# Patient Record
Sex: Female | Born: 1995 | Race: Black or African American | Hispanic: No | Marital: Single | Smoking: Never smoker
Health system: Southern US, Community
[De-identification: ages and names within clinical notes are randomized; demographics above are authoritative.]

## PROBLEM LIST (undated history)

## (undated) ENCOUNTER — Inpatient Hospital Stay (HOSPITAL_COMMUNITY): Payer: Self-pay

## (undated) DIAGNOSIS — D649 Anemia, unspecified: Secondary | ICD-10-CM

## (undated) DIAGNOSIS — R030 Elevated blood-pressure reading, without diagnosis of hypertension: Secondary | ICD-10-CM

## (undated) DIAGNOSIS — H52203 Unspecified astigmatism, bilateral: Secondary | ICD-10-CM

## (undated) DIAGNOSIS — I10 Essential (primary) hypertension: Secondary | ICD-10-CM

## (undated) HISTORY — DX: Elevated blood-pressure reading, without diagnosis of hypertension: R03.0

## (undated) HISTORY — DX: Unspecified astigmatism, bilateral: H52.203

## (undated) HISTORY — PX: DILATION AND CURETTAGE OF UTERUS: SHX78

## (undated) HISTORY — PX: NO PAST SURGERIES: SHX2092

---
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
## 2019-01-12 ENCOUNTER — Other Ambulatory Visit: Payer: Self-pay

## 2019-01-12 DIAGNOSIS — Z20822 Contact with and (suspected) exposure to covid-19: Secondary | ICD-10-CM

## 2019-01-14 LAB — NOVEL CORONAVIRUS, NAA: SARS-CoV-2, NAA: NOT DETECTED

## 2019-02-18 ENCOUNTER — Ambulatory Visit: Payer: HRSA Program | Attending: Internal Medicine

## 2019-02-18 DIAGNOSIS — Z20822 Contact with and (suspected) exposure to covid-19: Secondary | ICD-10-CM | POA: Insufficient documentation

## 2019-02-19 LAB — NOVEL CORONAVIRUS, NAA: SARS-CoV-2, NAA: NOT DETECTED

## 2019-02-22 ENCOUNTER — Encounter (HOSPITAL_COMMUNITY): Payer: Self-pay | Admitting: Emergency Medicine

## 2019-02-22 ENCOUNTER — Emergency Department (HOSPITAL_COMMUNITY): Payer: PRIVATE HEALTH INSURANCE

## 2019-02-22 ENCOUNTER — Other Ambulatory Visit: Payer: Self-pay

## 2019-02-22 ENCOUNTER — Emergency Department (HOSPITAL_COMMUNITY)
Admission: EM | Admit: 2019-02-22 | Discharge: 2019-02-22 | Disposition: A | Discharge status: Home / Self Care | Payer: PRIVATE HEALTH INSURANCE | Attending: Emergency Medicine | Admitting: Emergency Medicine

## 2019-02-22 DIAGNOSIS — W260XXA Contact with knife, initial encounter: Secondary | ICD-10-CM | POA: Insufficient documentation

## 2019-02-22 DIAGNOSIS — Y92512 Supermarket, store or market as the place of occurrence of the external cause: Secondary | ICD-10-CM | POA: Insufficient documentation

## 2019-02-22 DIAGNOSIS — Y990 Civilian activity done for income or pay: Secondary | ICD-10-CM | POA: Diagnosis not present

## 2019-02-22 DIAGNOSIS — S61216A Laceration without foreign body of right little finger without damage to nail, initial encounter: Secondary | ICD-10-CM | POA: Diagnosis present

## 2019-02-22 DIAGNOSIS — Y93G1 Activity, food preparation and clean up: Secondary | ICD-10-CM | POA: Insufficient documentation

## 2019-02-22 DIAGNOSIS — Z23 Encounter for immunization: Secondary | ICD-10-CM | POA: Diagnosis not present

## 2019-02-22 MED ORDER — TETANUS-DIPHTH-ACELL PERTUSSIS 5-2.5-18.5 LF-MCG/0.5 IM SUSP
0.5000 mL | Freq: Once | INTRAMUSCULAR | Status: AC
  Administered 2019-02-22: 18:00:00 0.5 mL via INTRAMUSCULAR
  Filled 2019-02-22: qty 0.5

## 2019-02-22 MED ORDER — CEPHALEXIN 500 MG PO CAPS: 500.0000 mg | ORAL_CAPSULE | Freq: Four times a day (QID) | ORAL | 0 refills | Status: DC

## 2019-02-22 MED ORDER — IBUPROFEN 800 MG PO TABS
800.0000 mg | ORAL_TABLET | Freq: Once | ORAL | Status: AC
  Administered 2019-02-22: 800 mg via ORAL
  Filled 2019-02-22: qty 1

## 2019-02-22 NOTE — ED Provider Notes (Signed)
[facility]-EMERGENCY DEPT Provider Note   CSN: [id number] Arrival date & time: 02/22/19  1711     History Chief Complaint  Patient presents with  . finger laceration    Diana Burke is a 24 y.o. female with a past medical history significant for elevated blood pressure who presents to the ED due to laceration to her right 5th finger that occurred around 4:45PM today. Patient notes she was working at [employer] when she placed her hand in dirty water that contained a knife. Patient notes the knife cut the lateral aspect of the distal portion of her right 5th finger. She rates her pain a 5/10 and describes it as a throbbing sensation, worse with movement. She has not tried anything for pain prior to arrival. Patient denies numbness/tingling. She is unsure when her last tetanus shot was. No treatment for her finger prior to arrival. Patient denies other injuries.      Past Medical History:  Diagnosis Date  . Astigmatism of both eyes 10/02/2012  . Elevated BP 10/02/2012   Suspect component of "White-coat" hypertension and contibution from obesity. Discussedwithpatient and mother the role of her weight in this BP elevation. Patient thought she would enjoy walking which was endorsed as a good plan. Declined nutrition consult for now.  Will recheck BP in 3 months along with weight.     Patient Active Problem List   Diagnosis Date Noted  . Tonsillar hypertrophy 10/04/2014  . Anemia 10/02/2012  . Astigmatism of both eyes, mild 10/02/2012  . Childhood obesity, BMI 95-100 percentile 10/02/2012  . Elevated BP 10/02/2012  . Pain in joint, ankle and foot 07/02/2012    History reviewed. No pertinent surgical history.   OB History   No obstetric history on file.     No family history on file.  Social History   Tobacco Use  . Smoking status: Never Smoker  Substance Use Topics  . Alcohol use: Not on file  . Drug use: Not on file    Home  Medications Prior to Admission medications   Medication Sig Start Date End Date Taking? Authorizing Provider  cephALEXin (KEFLEX) 500 MG capsule Take 1 capsule (500 mg total) by mouth 4 (four) times daily. 02/22/19   Mannie Stabile, PA-C    Allergies    Patient has no known allergies.  Review of Systems   Review of Systems  Constitutional: Negative for chills and fever.  Respiratory: Negative for shortness of breath.   Cardiovascular: Negative for chest pain.  Musculoskeletal: Positive for arthralgias.  Skin: Positive for wound (right 5th finger).  Neurological: Negative for numbness.    Physical Exam Updated Vital Signs BP (!) 155/111 (BP Location: Left Arm)   Pulse 94   Temp 98.9 F (37.2 C) (Oral)   Resp 18   LMP 02/20/2019   SpO2 100%   Physical Exam Vitals and nursing note reviewed.  Constitutional:      General: She is not in acute distress.    Appearance: She is not ill-appearing.  HENT:     Head: Normocephalic.  Eyes:     Conjunctiva/sclera: Conjunctivae normal.  Cardiovascular:     Rate and Rhythm: Normal rate and regular rhythm.     Pulses: Normal pulses.     Heart sounds: Normal heart sounds. No murmur. No friction rub. No gallop.   Pulmonary:     Effort: Pulmonary effort is normal.     Breath sounds: Normal breath sounds.  Abdominal:  General: Abdomen is flat. There is no distension.     Palpations: Abdomen is soft.     Tenderness: There is no abdominal tenderness. There is no guarding or rebound.  Musculoskeletal:     Cervical back: Neck supple.     Comments: Limited ROM of right 5th finger due to pain. Small, shallow laceration on lateral aspect of distal right 5th finger. Radial pulse intact. Sensation grossly intact. Full ROM of all other fingers. No snuffbox tenderness.   Skin:    Capillary Refill: Capillary refill takes less than 2 seconds.     Comments: Small shallow laceration on lateral aspect of right 5th finger. Hemostasis  maintained. See photo below.   Neurological:     General: No focal deficit present.     Mental Status: She is alert.  Psychiatric:        Mood and Affect: Mood is anxious.        Behavior: Behavior normal.       ED Results / Procedures / Treatments   Labs (all labs ordered are listed, but only abnormal results are displayed) Labs Reviewed - No data to display  EKG None  Radiology DG Finger Little Right  Result Date: 02/22/2019 CLINICAL DATA:  Laceration EXAM: RIGHT LITTLE FINGER 2+V COMPARISON:  None. FINDINGS: The direct site of laceration is not well visualized radiographically. No foreign body or soft tissue gas. Questionable lucency involving the dorsal aspect of the head of the fifth proximal phalanx. If this is at the site of laceration, could reflect small subjacent osseous injury, otherwise likely normal variant trabeculation. No other acute or suspicious osseous abnormality is seen. IMPRESSION: Questionable lucency involving the dorsal aspect of the head of the fifth proximal phalanx. If this is at the site of laceration, could reflect small subjacent osseous injury, otherwise likely normal variant trabeculation. Actual site of laceration is not well visualized radiographically. Electronically Signed   By: Kreg Shropshire M.D.   On: 02/22/2019 18:40    Procedures .Marland KitchenLaceration Repair  Date/Time: 02/22/2019 6:56 PM Performed by: Mannie Stabile, PA-C Authorized by: Mannie Stabile, PA-C   Consent:    Consent obtained:  Verbal   Consent given by:  Patient   Risks discussed:  Infection, need for additional repair, pain, poor cosmetic result and poor wound healing   Alternatives discussed:  No treatment and delayed treatment Universal protocol:    Procedure explained and questions answered to patient or proxy's satisfaction: yes     Relevant documents present and verified: yes     Test results available and properly labeled: yes     Imaging studies available: yes      Required blood products, implants, devices, and special equipment available: yes     Site/side marked: yes     Immediately prior to procedure, a time out was called: yes     Patient identity confirmed:  Verbally with patient Anesthesia (see MAR for exact dosages):    Anesthesia method:  None Laceration details:    Location:  Finger   Finger location:  R small finger   Length (cm):  2   Depth (mm):  3 Pre-procedure details:    Preparation:  Imaging obtained to evaluate for foreign bodies and patient was prepped and draped in usual sterile fashion Exploration:    Hemostasis achieved with:  Direct pressure   Wound exploration: wound explored through full range of motion and entire depth of wound probed and visualized     Wound extent: underlying  fracture (possible)     Wound extent: no areolar tissue violation noted, no fascia violation noted, no foreign bodies/material noted, no muscle damage noted and no tendon damage noted     Contaminated: no   Treatment:    Area cleansed with:  Saline and soap and water   Amount of cleaning:  Standard   Irrigation solution:  Sterile saline   Irrigation volume:  50   Irrigation method:  Syringe   Visualized foreign bodies/material removed: no   Skin repair:    Repair method:  Steri-Strips   Number of Steri-Strips:  2 Post-procedure details:    Dressing:  Splint for protection   Patient tolerance of procedure:  Tolerated well, no immediate complications   (including critical care time)  Medications Ordered in ED Medications  ibuprofen (ADVIL) tablet 800 mg (800 mg Oral Given 02/22/19 1821)  Tdap (BOOSTRIX) injection 0.5 mL (0.5 mLs Intramuscular Given 02/22/19 1822)    ED Course  I have reviewed the triage vital signs and the nursing notes.  Pertinent labs & imaging results that were available during my care of the patient were reviewed by me and considered in my medical decision making (see chart for details).    MDM  Rules/Calculators/A&P                     24 year old female presents to the ED due to laceration to right 5th finger after slicing it on a knife. Stable vitals. Patient in no acute distress and non-ill appearing. Tenderness to palpation over distal portion of left 5th finger with limited ROM due to pain. Small, shallow laceration on lateral aspect of 5th finger. Hemostasis maintained. Neurovascularly intact. Capillary refill <2. Tetanus updated. X-ray personally reviewed which demonstrates: Questionable lucency involving the dorsal aspect of the head of the  fifth proximal phalanx. If this is at the site of laceration, could  reflect small subjacent osseous injury, otherwise likely normal  variant trabeculation.   Laceration repair too shallow for sutures, placed steri strips. See procedure note above. Patient advised to take over the counter ibuprofen or tylenol as needed for pain. Ice therapy discussed with patient. Given the location and dirty water, will discharge patient with Keflex. Patient instructed to take all antibiotics as prescribed. Patient instructed to follow-up with PCP if symptoms do not improve within the next week. Strict ED precautions discussed with patient. Patient states understanding and agrees to plan. Patient discharged home in no acute distress and stable vitals  Final Clinical Impression(s) / ED Diagnoses Final diagnoses:  Laceration of right little finger without foreign body without damage to nail, initial encounter    Rx / DC Orders ED Discharge Orders         Ordered    cephALEXin (KEFLEX) 500 MG capsule  4 times daily     02/22/19 1853           Suzy Bouchard, PA-C 02/22/19 1900    Drenda Freeze, MD 02/22/19 [phone]

## 2019-02-22 NOTE — ED Triage Notes (Signed)
Per pt, states she was washing dishes-states she stuck her hand in dirty water not knowing there was a knife in the water-laceration to right pointer finger, bleeding controlled at this time

## 2019-02-22 NOTE — Discharge Instructions (Signed)
As discussed, the steri strips will fall off on their own. You may keep the splint on as needed for pain. You can take over counter ibuprofen or tylenol as needed for pain. I have prescribed an antibiotic. Take 4 times a day for 5 days. If your symptoms do not improve within the next week, follow-up with your PCP. Return to the ER for new or worsening symptoms.

## 2019-03-18 ENCOUNTER — Ambulatory Visit: Payer: Self-pay | Attending: Internal Medicine

## 2019-03-18 DIAGNOSIS — Z20822 Contact with and (suspected) exposure to covid-19: Secondary | ICD-10-CM

## 2019-03-19 LAB — NOVEL CORONAVIRUS, NAA: SARS-CoV-2, NAA: NOT DETECTED

## 2019-12-28 ENCOUNTER — Ambulatory Visit
Admission: RE | Admit: 2019-12-28 | Discharge: 2019-12-28 | Disposition: A | Discharge status: Home / Self Care | Payer: BC Managed Care – PPO | Source: Ambulatory Visit | Attending: Emergency Medicine | Admitting: Emergency Medicine

## 2019-12-28 ENCOUNTER — Other Ambulatory Visit: Payer: Self-pay

## 2019-12-28 VITALS — BP 132/82 | HR 89 | Temp 99.0°F | Resp 20

## 2019-12-28 DIAGNOSIS — S30860A Insect bite (nonvenomous) of lower back and pelvis, initial encounter: Secondary | ICD-10-CM | POA: Diagnosis not present

## 2019-12-28 DIAGNOSIS — W57XXXA Bitten or stung by nonvenomous insect and other nonvenomous arthropods, initial encounter: Secondary | ICD-10-CM | POA: Diagnosis not present

## 2019-12-28 MED ORDER — DOXYCYCLINE HYCLATE 100 MG PO CAPS: 100.0000 mg | ORAL_CAPSULE | Freq: Two times a day (BID) | ORAL | 0 refills | Status: DC

## 2019-12-28 MED ORDER — TRIAMCINOLONE ACETONIDE 0.1 % EX CREA: 1.0000 "application " | TOPICAL_CREAM | Freq: Two times a day (BID) | CUTANEOUS | 0 refills | Status: DC

## 2019-12-28 NOTE — Discharge Instructions (Signed)
Apply warm compresses 3-4x daily for 10-15 minutes Wash site daily with warm water and mild soap Keep covered to avoid friction Take antibiotic as prescribed and to completion Steroid cream prescribed for itching Follow up here or with PCP if symptoms persists Return or go to the ED if you have any new or worsening symptoms increased redness, swelling, pain, nausea, vomiting, fever, chills, etc..Marland Kitchen

## 2019-12-28 NOTE — ED Provider Notes (Signed)
[facility]   [id number] 12/28/19 Arrival Time: 1509   CC: Insect bite  SUBJECTIVE:  Diana Burke is a 24 y.o. female who presents with a possible insect bite to LT buttock that occurred 1-2 days ago. Did not see insect that bit or stung her.  States it is swollen, red, itchy, warm to the touch, and sore to the touch.  Has tried ice pack without relief.  Worse with sitting on buttock.  Denies fever, chills, nausea, vomiting, drainage or bleeding.    ROS: As per HPI.  All other pertinent ROS negative.     Past Medical History:  Diagnosis Date  . Astigmatism of both eyes 10/02/2012  . Elevated BP 10/02/2012   Suspect component of "White-coat" hypertension and contibution from obesity. Discussedwithpatient and mother the role of her weight in this BP elevation. Patient thought she would enjoy walking which was endorsed as a good plan. Declined nutrition consult for now.  Will recheck BP in 3 months along with weight.    History reviewed. No pertinent surgical history. No Known Allergies No current facility-administered medications on file prior to encounter.   No current outpatient medications on file prior to encounter.   Social History   Socioeconomic History  . Marital status: Single    Spouse name: Not on file  . Number of children: Not on file  . Years of education: Not on file  . Highest education level: Not on file  Occupational History  . Not on file  Tobacco Use  . Smoking status: Never Smoker  . Smokeless tobacco: Never Used  Substance and Sexual Activity  . Alcohol use: Yes  . Drug use: Not Currently  . Sexual activity: Not on file  Other Topics Concern  . Not on file  Social History Narrative  . Not on file   Social Determinants of Health   Financial Resource Strain:   . Difficulty of Paying Living Expenses: Not on file  Food Insecurity:   . Worried About Programme researcher, broadcasting/film/video in the Last Year: Not on file  . Ran Out of Food in the Last Year:  Not on file  Transportation Needs:   . Lack of Transportation (Medical): Not on file  . Lack of Transportation (Non-Medical): Not on file  Physical Activity:   . Days of Exercise per Week: Not on file  . Minutes of Exercise per Session: Not on file  Stress:   . Feeling of Stress : Not on file  Social Connections:   . Frequency of Communication with Friends and Family: Not on file  . Frequency of Social Gatherings with Friends and Family: Not on file  . Attends Religious Services: Not on file  . Active Member of Clubs or Organizations: Not on file  . Attends Banker Meetings: Not on file  . Marital Status: Not on file  Intimate Partner Violence:   . Fear of Current or Ex-Partner: Not on file  . Emotionally Abused: Not on file  . Physically Abused: Not on file  . Sexually Abused: Not on file   Family History  Family history unknown: Yes    OBJECTIVE:  Vitals:   12/28/19 1541  BP: 132/82  Pulse: 89  Resp: 20  Temp: 99 F (37.2 C)  SpO2: 97%     General appearance: alert; no distress Skin: 3 cm area of erythema to LT lateral buttock, mildly TTP, small scab most likely from excoriation, no bleeding or drainage Psychological: alert and  cooperative; normal mood and affect  ASSESSMENT & PLAN:  1. Insect bite of buttock with local reaction, initial encounter    Meds ordered this encounter  Medications  . doxycycline (VIBRAMYCIN) 100 MG capsule    Sig: Take 1 capsule (100 mg total) by mouth 2 (two) times daily.    Dispense:  20 capsule    Refill:  0    Order Specific Question:   Supervising Provider    Answer:   Eustace Moore [[id number]]  . triamcinolone (KENALOG) 0.1 %    Sig: Apply 1 application topically 2 (two) times daily.    Dispense:  30 g    Refill:  0    Order Specific Question:   Supervising Provider    Answer:   Eustace Moore [[id number]]    Apply warm compresses 3-4x daily for 10-15 minutes Wash site daily with warm water and mild  soap Keep covered to avoid friction Take antibiotic as prescribed and to completion Steroid cream prescribed for itching Follow up here or with PCP if symptoms persists Return or go to the ED if you have any new or worsening symptoms increased redness, swelling, pain, nausea, vomiting, fever, chills, etc...   Reviewed expectations re: course of current medical issues. Questions answered. Outlined signs and symptoms indicating need for more acute intervention. Patient verbalized understanding. After Visit Summary given.          Rennis Harding, PA-C 12/28/19 [phone]

## 2019-12-28 NOTE — ED Triage Notes (Signed)
Pt presents with possible spider bite to left buttock area

## 2021-05-03 IMAGING — CR DG FINGER LITTLE 2+V*R*
3 series · 3 of 3 positions shown · non-contrast
Comparison: None.

CLINICAL DATA: Laceration

EXAM:
RIGHT LITTLE FINGER 2+V

[x finger pa right]
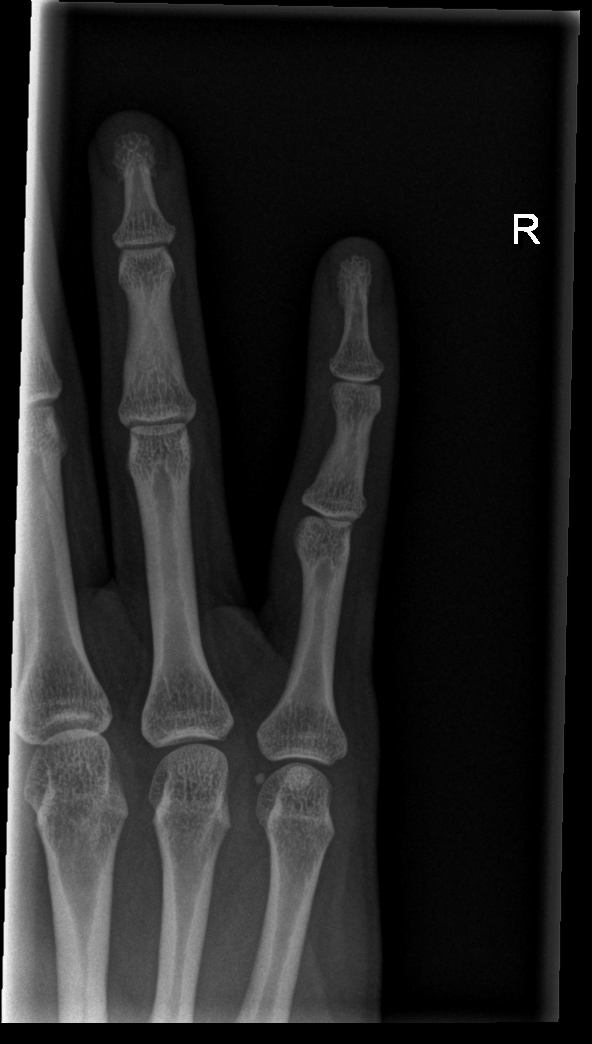

[x finger obl right]
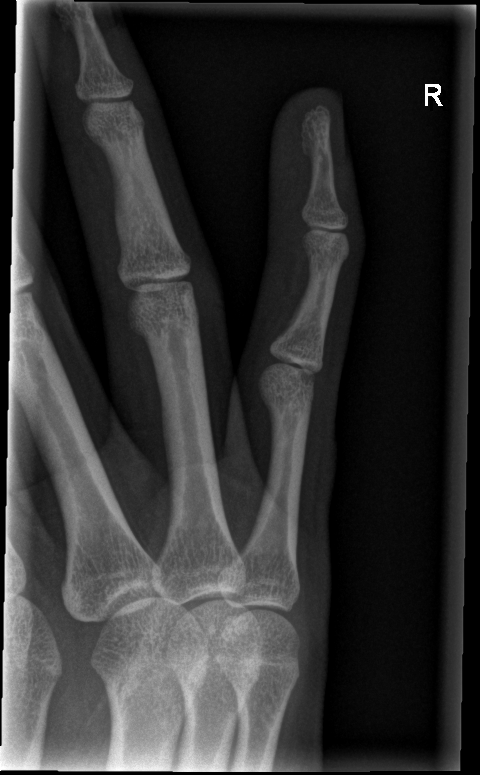

[x finger lat right]
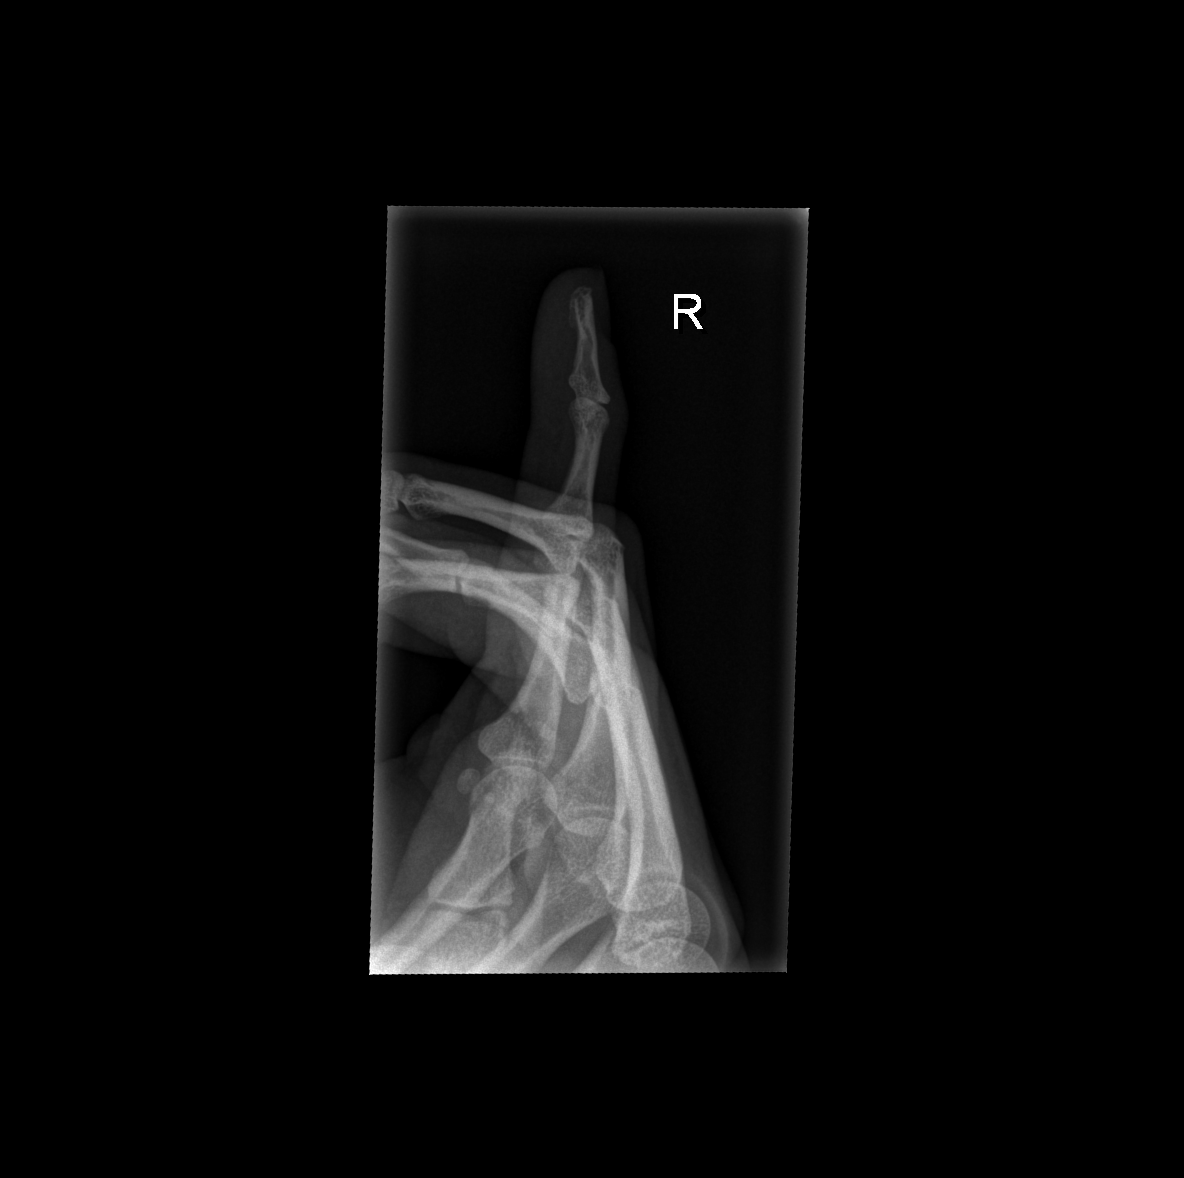

[3 of 3 positions shown; findings below may reference images not displayed]

FINDINGS: The direct site of laceration is not well visualized
radiographically. No foreign body or soft tissue gas. Questionable
lucency involving the dorsal aspect of the head of the fifth
proximal phalanx. If this is at the site of laceration, could
reflect small subjacent osseous injury, otherwise likely normal
variant trabeculation. No other acute or suspicious osseous
abnormality is seen.
IMPRESSION: Questionable lucency involving the dorsal aspect of the head of the
fifth proximal phalanx. If this is at the site of laceration, could
reflect small subjacent osseous injury, otherwise likely normal
variant trabeculation.

Actual site of laceration is not well visualized radiographically.

## 2021-07-25 ENCOUNTER — Other Ambulatory Visit: Payer: Self-pay

## 2021-07-25 ENCOUNTER — Emergency Department (HOSPITAL_BASED_OUTPATIENT_CLINIC_OR_DEPARTMENT_OTHER)
Admission: EM | Admit: 2021-07-25 | Discharge: 2021-07-25 | Disposition: A | Discharge status: Home / Self Care | Payer: BC Managed Care – PPO

## 2021-07-25 NOTE — ED Notes (Signed)
Per registration pt leaving 

## 2021-08-29 ENCOUNTER — Inpatient Hospital Stay (HOSPITAL_COMMUNITY)
Admission: AD | Admit: 2021-08-29 | Discharge: 2021-08-29 | Disposition: A | Discharge status: Home / Self Care | Payer: BC Managed Care – PPO | Attending: Obstetrics and Gynecology | Admitting: Obstetrics and Gynecology

## 2021-08-29 ENCOUNTER — Inpatient Hospital Stay (HOSPITAL_COMMUNITY): Payer: BC Managed Care – PPO

## 2021-08-29 ENCOUNTER — Encounter (HOSPITAL_COMMUNITY): Payer: Self-pay

## 2021-08-29 DIAGNOSIS — O26899 Other specified pregnancy related conditions, unspecified trimester: Secondary | ICD-10-CM

## 2021-08-29 DIAGNOSIS — O021 Missed abortion: Secondary | ICD-10-CM | POA: Diagnosis present

## 2021-08-29 LAB — CBC
HCT: 37.9 % (ref 36.0–46.0)
Hemoglobin: 12.3 g/dL (ref 12.0–15.0)
MCH: 27.7 pg (ref 26.0–34.0)
MCHC: 32.5 g/dL (ref 30.0–36.0)
MCV: 85.4 fL (ref 80.0–100.0)
Platelets: 318 10*3/uL (ref 150–400)
RBC: 4.44 MIL/uL (ref 3.87–5.11)
RDW: 13.1 % (ref 11.5–15.5)
WBC: 6.9 10*3/uL (ref 4.0–10.5)
nRBC: 0 % (ref 0.0–0.2)

## 2021-08-29 LAB — URINALYSIS, ROUTINE W REFLEX MICROSCOPIC
Bilirubin Urine: NEGATIVE
Glucose, UA: NEGATIVE mg/dL
Hgb urine dipstick: NEGATIVE
Ketones, ur: NEGATIVE mg/dL
Leukocytes,Ua: NEGATIVE
Nitrite: NEGATIVE
Protein, ur: NEGATIVE mg/dL
Specific Gravity, Urine: 1.02 (ref 1.005–1.030)
pH: 7 (ref 5.0–8.0)

## 2021-08-29 LAB — HCG, QUANTITATIVE, PREGNANCY: hCG, Beta Chain, Quant, S: 11748 m[IU]/mL — ABNORMAL HIGH (ref <5)

## 2021-08-29 LAB — WET PREP, GENITAL
Clue Cells Wet Prep HPF POC: NONE SEEN
Sperm: NONE SEEN
Trich, Wet Prep: NONE SEEN
WBC, Wet Prep HPF POC: <10 (ref <10)
Yeast Wet Prep HPF POC: NONE SEEN

## 2021-08-29 LAB — POCT PREGNANCY, URINE: Preg Test, Ur: POSITIVE — AB

## 2021-08-29 LAB — ABO/RH
ABO/RH(D): A NEG
Antibody Screen: NEGATIVE

## 2021-08-29 MED ORDER — RHO D IMMUNE GLOBULIN 1500 UNIT/2ML IJ SOSY
300.0000 ug | PREFILLED_SYRINGE | Freq: Once | INTRAMUSCULAR | Status: AC
  Administered 2021-08-29: 300 ug via INTRAMUSCULAR
  Filled 2021-08-29: qty 2

## 2021-08-29 MED ORDER — PROMETHAZINE HCL 25 MG PO TABS: 25.0000 mg | ORAL_TABLET | Freq: Four times a day (QID) | ORAL | 0 refills | Status: DC | PRN

## 2021-08-29 MED ORDER — SCOPOLAMINE 1 MG/3DAYS TD PT72: 1.0000 | MEDICATED_PATCH | TRANSDERMAL | 12 refills | Status: DC

## 2021-08-29 MED ORDER — PROMETHAZINE HCL 25 MG PO TABS
25.0000 mg | ORAL_TABLET | Freq: Four times a day (QID) | ORAL | Status: DC | PRN
  Administered 2021-08-29: 25 mg via ORAL
  Filled 2021-08-29: qty 1

## 2021-08-29 MED ORDER — SCOPOLAMINE 1 MG/3DAYS TD PT72
1.0000 | MEDICATED_PATCH | TRANSDERMAL | Status: DC
  Administered 2021-08-29: 1.5 mg via TRANSDERMAL
  Filled 2021-08-29: qty 1

## 2021-08-29 NOTE — MAU Note (Signed)
Notified [facility] of Rh IG Workup add-on.

## 2021-08-29 NOTE — MAU Note (Addendum)
...  Diana Burke is a 26 y.o. at approximately [redacted]w[redacted]d here in MAU reporting: Vaginal spotting two weeks ago that occurred for one day. She reports over the last few days her bleeding has been a lighter red but today she noted she passed several "pepper flake" sized clots. She is also endorsing middle lower abdominal pain that began one week ago that is intermittent. Last IC two days ago.   U/S received and pictures shown from [facility] on 08/01/2021 and she reports she was told she was measuring [redacted]w[redacted]d.  LMP: 06/10/2021 Onset of complaint:  Pain score: 4/10 lower abdomen   FHT: unable to doppler FHT Lab orders placed from triage: UA

## 2021-08-29 NOTE — MAU Provider Note (Signed)
History     CSN: [id number]  Arrival date and time: 08/29/21 0840   Event Date/Time   First Provider Initiated Contact with Patient 08/29/21 [phone]      Chief Complaint  Patient presents with   Abdominal Pain   Vaginal Bleeding   25 y.o. G1 @11 .3 wks by LMP presenting with cramping and spotting. Reports red spotting when she wipes, sometimes after IC. Spotting started a few weeks ago. Cramping is bilateral and central, describes like menstrual cramps but not as bad. Rates pain 4/10. Denies urinary sx. Denies vaginal itching or malodor. Endorses N/V daily. Has not used any treatments for it.    OB History     Gravida  1   Para      Term      Preterm      AB      Living         SAB      IAB      Ectopic      Multiple      Live Births              Past Medical History:  Diagnosis Date   Astigmatism of both eyes 10/02/2012   Elevated BP 10/02/2012   Suspect component of "White-coat" hypertension and contibution from obesity. Discussedwithpatient and mother the role of her weight in this BP elevation. Patient thought she would enjoy walking which was endorsed as a good plan. Declined nutrition consult for now.  Will recheck BP in 3 months along with weight.     History reviewed. No pertinent surgical history.  Family History  Problem Relation Age of Onset   Cancer Paternal Grandmother     Social History   Tobacco Use   Smoking status: Never   Smokeless tobacco: Never  Vaping Use   Vaping Use: Never used  Substance Use Topics   Alcohol use: Not Currently   Drug use: Not Currently    Allergies:  Allergies  Allergen Reactions   Carrot [Daucus Carota]    Peanut-Containing Drug Products     Medications Prior to Admission  Medication Sig Dispense Refill Last Dose   Prenatal Vit-Fe Fumarate-FA (PRENATAL MULTIVITAMIN) TABS tablet Take 1 tablet by mouth daily at 12 noon.      doxycycline (VIBRAMYCIN) 100 MG capsule Take 1 capsule (100 mg total) by  mouth 2 (two) times daily. 20 capsule 0    triamcinolone (KENALOG) 0.1 % Apply 1 application topically 2 (two) times daily. 30 g 0    Review of Systems  Gastrointestinal:  Positive for abdominal pain, nausea and vomiting.  Genitourinary:  Positive for vaginal bleeding. Negative for dysuria, frequency, urgency and vaginal discharge.   Physical Exam   Blood pressure (!) 141/101, pulse 91, temperature 98.4 F (36.9 C), temperature source Oral, resp. rate 15, height 5\' 8"  (1.727 m), last menstrual period 06/10/2021, SpO2 99 %.  Physical Exam Vitals and nursing note reviewed.  Constitutional:      General: She is not in acute distress.    Appearance: Normal appearance.  HENT:     Head: Normocephalic and atraumatic.  Cardiovascular:     Rate and Rhythm: Normal rate.  Pulmonary:     Effort: Pulmonary effort is normal. No respiratory distress.  Abdominal:     General: There is no distension.     Palpations: Abdomen is soft. There is no mass.     Tenderness: There is no abdominal tenderness. There is no guarding or rebound.  Hernia: No hernia is present.  Musculoskeletal:        General: Normal range of motion.     Cervical back: Normal range of motion.  Skin:    General: Skin is warm and dry.  Neurological:     General: No focal deficit present.     Mental Status: She is alert and oriented to person, place, and time.  Psychiatric:        Mood and Affect: Mood normal.        Behavior: Behavior normal.   Results for orders placed or performed during the hospital encounter of 08/29/21 (from the past 24 hour(s))  Pregnancy, urine POC     Status: Abnormal   Collection Time: 08/29/21  9:13 AM  Result Value Ref Range   Preg Test, Ur POSITIVE (A) NEGATIVE  ABO/Rh     Status: None   Collection Time: 08/29/21 10:19 AM  Result Value Ref Range   ABO/RH(D) A NEG    Antibody Screen      NEG Performed at [facility], [address], [place], [place] [address]   CBC     Status:  None   Collection Time: 08/29/21 10:19 AM  Result Value Ref Range   WBC 6.9 4.0 - 10.5 K/uL   RBC 4.44 3.87 - 5.11 MIL/uL   Hemoglobin 12.3 12.0 - 15.0 g/dL   HCT 60.4 54.0 - 98.1 %   MCV 85.4 80.0 - 100.0 fL   MCH 27.7 26.0 - 34.0 pg   MCHC 32.5 30.0 - 36.0 g/dL   RDW 19.1 47.8 - 29.5 %   Platelets 318 150 - 400 K/uL   nRBC 0.0 0.0 - 0.2 %  hCG, quantitative, pregnancy     Status: Abnormal   Collection Time: 08/29/21 10:19 AM  Result Value Ref Range   hCG, Beta Chain, Quant, S 11,748 (H) <5 mIU/mL  Wet prep, genital     Status: None   Collection Time: 08/29/21 10:41 AM   Specimen: PATH Cytology Cervicovaginal Ancillary Only  Result Value Ref Range   Yeast Wet Prep HPF POC NONE SEEN NONE SEEN   Trich, Wet Prep NONE SEEN NONE SEEN   Clue Cells Wet Prep HPF POC NONE SEEN NONE SEEN   WBC, Wet Prep HPF POC <10 <10   Sperm NONE SEEN   Urinalysis, Routine w reflex microscopic Urine, Clean Catch     Status: None   Collection Time: 08/29/21 10:44 AM  Result Value Ref Range   Color, Urine YELLOW YELLOW   APPearance CLEAR CLEAR   Specific Gravity, Urine 1.020 1.005 - 1.030   pH 7.0 5.0 - 8.0   Glucose, UA NEGATIVE NEGATIVE mg/dL   Hgb urine dipstick NEGATIVE NEGATIVE   Bilirubin Urine NEGATIVE NEGATIVE   Ketones, ur NEGATIVE NEGATIVE mg/dL   Protein, ur NEGATIVE NEGATIVE mg/dL   Nitrite NEGATIVE NEGATIVE   Leukocytes,Ua NEGATIVE NEGATIVE  Rh IG workup (includes ABO/Rh)     Status: None (Preliminary result)   Collection Time: 08/29/21 11:22 AM  Result Value Ref Range   Gestational Age(Wks) 11    Unit Number [id number]    Blood Component Type RHIG    Unit division 00    Status of Unit ISSUED    Transfusion Status      OK TO TRANSFUSE Performed at [facility], [address] [place], [place] [address]     US OB LESS THAN 14 WEEKS WITH [place] TRANSVAGINAL  Result Date: 08/29/2021  CLINICAL DATA:  Pelvic pain and vaginal spotting. Patient is pregnant. EXAM: OBSTETRIC  <14 WK [place] AND TRANSVAGINAL OB US TECHNIQUE: Both transabdominal and transvaginal ultrasound examinations were performed for complete evaluation of the gestation as well as the maternal uterus, adnexal regions, and pelvic cul-de-sac. Transvaginal technique was performed to assess early pregnancy. COMPARISON:  None Available. FINDINGS: Intrauterine gestational sac: Present Yolk sac:  None Embryo:  Present Cardiac Activity: None Heart Rate: N/A bpm CRL:  20.77 mm   8 w   5 d Subchorionic hemorrhage:  Small Maternal uterus/adnexae: The ovaries are normal. IMPRESSION: Nonviable 8 week 5 day gestation fetus. Electronically Signed   By: Rudie Meyer M.D.   On: 08/29/2021 11:28    MAU Course  Procedures Rhogam  MDM Labs and [place] ordered and reviewed. MAB identified on [place]. Pt and partner informed, condolences given. Options for management discussed. Pt prefers expectant for now. Has appt at [facility] tomorrow and would like to discuss further then. Stable for discharge home.   Assessment and Plan   1. Missed abortion   2. Rh negative state in antepartum period    Discharge home Follow up at [facility] tomorrow Return precautions  Allergies as of 08/29/2021       Reactions   Carrot [daucus Carota]    Peanut-containing Drug Products         Medication List     STOP taking these medications    doxycycline 100 MG capsule Commonly known as: VIBRAMYCIN   triamcinolone cream 0.1 % Commonly known as: KENALOG       TAKE these medications    prenatal multivitamin Tabs tablet Take 1 tablet by mouth daily at 12 noon.   promethazine 25 MG tablet Commonly known as: PHENERGAN Take 1 tablet (25 mg total) by mouth every 6 (six) hours as needed for nausea or vomiting.   scopolamine 1 MG/3DAYS Commonly known as: TRANSDERM-SCOP Place 1 patch (1.5 mg total) onto the skin every 3 (three) days. Start taking on: September 01, 2021        Donette Larry, [place] 08/29/2021, 10:12 AM

## 2021-08-30 LAB — GC/CHLAMYDIA PROBE AMP (~~LOC~~) NOT AT ARMC
Chlamydia: NEGATIVE
Comment: NEGATIVE
Comment: NORMAL
Neisseria Gonorrhea: NEGATIVE

## 2021-08-30 LAB — RH IG WORKUP (INCLUDES ABO/RH)
Gestational Age(Wks): 11
Unit division: 0

## 2021-09-02 ENCOUNTER — Inpatient Hospital Stay (HOSPITAL_BASED_OUTPATIENT_CLINIC_OR_DEPARTMENT_OTHER)
Admission: EM | Admit: 2021-09-02 | Discharge: 2021-09-02 | Disposition: A | Discharge status: Home / Self Care | Payer: BC Managed Care – PPO | Attending: Obstetrics and Gynecology | Admitting: Obstetrics and Gynecology

## 2021-09-02 ENCOUNTER — Encounter (HOSPITAL_COMMUNITY)
Admission: EM | Disposition: A | Discharge status: Home / Self Care | Payer: Self-pay | Source: Home / Self Care | Attending: Emergency Medicine

## 2021-09-02 ENCOUNTER — Other Ambulatory Visit: Payer: Self-pay

## 2021-09-02 ENCOUNTER — Encounter (HOSPITAL_BASED_OUTPATIENT_CLINIC_OR_DEPARTMENT_OTHER): Payer: Self-pay

## 2021-09-02 ENCOUNTER — Emergency Department (HOSPITAL_BASED_OUTPATIENT_CLINIC_OR_DEPARTMENT_OTHER): Payer: BC Managed Care – PPO

## 2021-09-02 ENCOUNTER — Inpatient Hospital Stay (HOSPITAL_COMMUNITY): Payer: BC Managed Care – PPO | Admitting: Anesthesiology

## 2021-09-02 DIAGNOSIS — O034 Incomplete spontaneous abortion without complication: Secondary | ICD-10-CM | POA: Diagnosis not present

## 2021-09-02 DIAGNOSIS — R109 Unspecified abdominal pain: Secondary | ICD-10-CM | POA: Diagnosis present

## 2021-09-02 HISTORY — PX: DILATION AND EVACUATION: SHX1459

## 2021-09-02 HISTORY — DX: Anemia, unspecified: D64.9

## 2021-09-02 LAB — CBC WITH DIFFERENTIAL/PLATELET
Abs Immature Granulocytes: 0.03 10*3/uL (ref 0.00–0.07)
Basophils Absolute: 0.1 10*3/uL (ref 0.0–0.1)
Basophils Relative: 1 %
Eosinophils Absolute: 0.2 10*3/uL (ref 0.0–0.5)
Eosinophils Relative: 2 %
HCT: 37.5 % (ref 36.0–46.0)
Hemoglobin: 12.5 g/dL (ref 12.0–15.0)
Immature Granulocytes: 0 %
Lymphocytes Relative: 23 %
Lymphs Abs: 2.5 10*3/uL (ref 0.7–4.0)
MCH: 27.7 pg (ref 26.0–34.0)
MCHC: 33.3 g/dL (ref 30.0–36.0)
MCV: 83.1 fL (ref 80.0–100.0)
Monocytes Absolute: 0.6 10*3/uL (ref 0.1–1.0)
Monocytes Relative: 6 %
Neutro Abs: 7.6 10*3/uL (ref 1.7–7.7)
Neutrophils Relative %: 68 %
Platelets: 338 10*3/uL (ref 150–400)
RBC: 4.51 MIL/uL (ref 3.87–5.11)
RDW: 12.9 % (ref 11.5–15.5)
WBC: 11 10*3/uL — ABNORMAL HIGH (ref 4.0–10.5)
nRBC: 0 % (ref 0.0–0.2)

## 2021-09-02 LAB — BASIC METABOLIC PANEL
Anion gap: 11 (ref 5–15)
BUN: 13 mg/dL (ref 6–20)
CO2: 21 mmol/L — ABNORMAL LOW (ref 22–32)
Calcium: 9.4 mg/dL (ref 8.9–10.3)
Chloride: 106 mmol/L (ref 98–111)
Creatinine, Ser: 0.84 mg/dL (ref 0.44–1.00)
GFR, Estimated: >60 mL/min (ref >60)
Glucose, Bld: 96 mg/dL (ref 70–99)
Potassium: 3.6 mmol/L (ref 3.5–5.1)
Sodium: 138 mmol/L (ref 135–145)

## 2021-09-02 SURGERY — DILATION AND EVACUATION, UTERUS
Anesthesia: Monitor Anesthesia Care

## 2021-09-02 MED ORDER — HYDROMORPHONE HCL 1 MG/ML IJ SOLN
1.0000 mg | Freq: Once | INTRAMUSCULAR | Status: DC
  Filled 2021-09-02: qty 1

## 2021-09-02 MED ORDER — CARBOPROST TROMETHAMINE 250 MCG/ML IM SOLN
INTRAMUSCULAR | Status: AC
  Filled 2021-09-02: qty 1

## 2021-09-02 MED ORDER — FENTANYL CITRATE (PF) 250 MCG/5ML IJ SOLN
INTRAMUSCULAR | Status: AC
  Filled 2021-09-02: qty 5

## 2021-09-02 MED ORDER — LIDOCAINE HCL (PF) 1 % IJ SOLN
INTRAMUSCULAR | Status: AC
  Filled 2021-09-02: qty 30

## 2021-09-02 MED ORDER — PROMETHAZINE HCL 25 MG/ML IJ SOLN: 6.2500 mg | INTRAMUSCULAR | Status: DC | PRN

## 2021-09-02 MED ORDER — DEXAMETHASONE SODIUM PHOSPHATE 10 MG/ML IJ SOLN
INTRAMUSCULAR | Status: DC | PRN
  Administered 2021-09-02: 10 mg via INTRAVENOUS

## 2021-09-02 MED ORDER — METHYLERGONOVINE MALEATE 0.2 MG/ML IJ SOLN
INTRAMUSCULAR | Status: AC
  Filled 2021-09-02: qty 1

## 2021-09-02 MED ORDER — 0.9 % SODIUM CHLORIDE (POUR BTL) OPTIME
TOPICAL | Status: DC | PRN
  Administered 2021-09-02: 1000 mL

## 2021-09-02 MED ORDER — ACETAMINOPHEN 10 MG/ML IV SOLN
1000.0000 mg | Freq: Once | INTRAVENOUS | Status: DC | PRN
  Administered 2021-09-02: 1000 mg via INTRAVENOUS

## 2021-09-02 MED ORDER — LACTATED RINGERS IV SOLN: INTRAVENOUS | Status: DC | PRN

## 2021-09-02 MED ORDER — ONDANSETRON HCL 4 MG/2ML IJ SOLN
INTRAMUSCULAR | Status: DC | PRN
  Administered 2021-09-02: 4 mg via INTRAVENOUS

## 2021-09-02 MED ORDER — OXYCODONE HCL 5 MG/5ML PO SOLN: 5.0000 mg | Freq: Once | ORAL | Status: DC | PRN

## 2021-09-02 MED ORDER — LACTATED RINGERS IV BOLUS
1000.0000 mL | Freq: Once | INTRAVENOUS | Status: AC
  Administered 2021-09-02: 1000 mL via INTRAVENOUS

## 2021-09-02 MED ORDER — KETOROLAC TROMETHAMINE 30 MG/ML IJ SOLN
30.0000 mg | Freq: Once | INTRAMUSCULAR | Status: AC
  Administered 2021-09-02: 30 mg via INTRAVENOUS
  Filled 2021-09-02: qty 1

## 2021-09-02 MED ORDER — DOXYCYCLINE HYCLATE 50 MG PO CAPS: 100.0000 mg | ORAL_CAPSULE | Freq: Two times a day (BID) | ORAL | 0 refills | Status: AC

## 2021-09-02 MED ORDER — PROPOFOL 1000 MG/100ML IV EMUL
INTRAVENOUS | Status: AC
  Filled 2021-09-02: qty 100

## 2021-09-02 MED ORDER — HYDROMORPHONE HCL 1 MG/ML IJ SOLN
1.0000 mg | Freq: Once | INTRAMUSCULAR | Status: AC
  Administered 2021-09-02: 1 mg via INTRAVENOUS

## 2021-09-02 MED ORDER — LACTATED RINGERS IV SOLN: INTRAVENOUS | Status: DC

## 2021-09-02 MED ORDER — LIDOCAINE HCL 1 % IJ SOLN
INTRAMUSCULAR | Status: DC | PRN
  Administered 2021-09-02: 20 mL

## 2021-09-02 MED ORDER — ONDANSETRON HCL 4 MG/2ML IJ SOLN
4.0000 mg | Freq: Once | INTRAMUSCULAR | Status: AC
  Administered 2021-09-02: 4 mg via INTRAVENOUS
  Filled 2021-09-02: qty 2

## 2021-09-02 MED ORDER — MIDAZOLAM HCL 2 MG/2ML IJ SOLN
INTRAMUSCULAR | Status: DC | PRN
  Administered 2021-09-02: 2 mg via INTRAVENOUS

## 2021-09-02 MED ORDER — DEXMEDETOMIDINE (PRECEDEX) IN NS 20 MCG/5ML (4 MCG/ML) IV SYRINGE
PREFILLED_SYRINGE | INTRAVENOUS | Status: DC | PRN
  Administered 2021-09-02 (×2): 20 ug via INTRAVENOUS

## 2021-09-02 MED ORDER — PROPOFOL 10 MG/ML IV BOLUS
INTRAVENOUS | Status: DC | PRN
  Administered 2021-09-02 (×4): 50 mg via INTRAVENOUS

## 2021-09-02 MED ORDER — ORAL CARE MOUTH RINSE: 15.0000 mL | Freq: Once | OROMUCOSAL | Status: AC

## 2021-09-02 MED ORDER — HYDROMORPHONE HCL 1 MG/ML IJ SOLN
1.0000 mg | Freq: Once | INTRAMUSCULAR | Status: AC
  Administered 2021-09-02: 1 mg via INTRAVENOUS
  Filled 2021-09-02: qty 1

## 2021-09-02 MED ORDER — PROPOFOL 10 MG/ML IV BOLUS
INTRAVENOUS | Status: AC
  Filled 2021-09-02: qty 20

## 2021-09-02 MED ORDER — MIDAZOLAM HCL 2 MG/2ML IJ SOLN
INTRAMUSCULAR | Status: AC
  Filled 2021-09-02: qty 2

## 2021-09-02 MED ORDER — OXYCODONE HCL 5 MG PO TABS: 5.0000 mg | ORAL_TABLET | Freq: Once | ORAL | Status: DC | PRN

## 2021-09-02 MED ORDER — CHLORHEXIDINE GLUCONATE 0.12 % MT SOLN
15.0000 mL | Freq: Once | OROMUCOSAL | Status: AC
Start: 2021-09-02 — End: 2021-09-02
  Administered 2021-09-02: 15 mL via OROMUCOSAL
  Filled 2021-09-02: qty 15

## 2021-09-02 MED ORDER — ACETAMINOPHEN 10 MG/ML IV SOLN
INTRAVENOUS | Status: AC
  Filled 2021-09-02: qty 100

## 2021-09-02 MED ORDER — DEXMEDETOMIDINE HCL IN NACL 80 MCG/20ML IV SOLN
INTRAVENOUS | Status: AC
  Filled 2021-09-02: qty 20

## 2021-09-02 MED ORDER — FENTANYL CITRATE (PF) 100 MCG/2ML IJ SOLN
INTRAMUSCULAR | Status: DC | PRN
Start: 2021-09-02 — End: 2021-09-02
  Administered 2021-09-02: 50 ug via INTRAVENOUS
  Administered 2021-09-02 (×2): 100 ug via INTRAVENOUS

## 2021-09-02 MED ORDER — AMISULPRIDE (ANTIEMETIC) 5 MG/2ML IV SOLN: 10.0000 mg | Freq: Once | INTRAVENOUS | Status: DC | PRN

## 2021-09-02 MED ORDER — KETOROLAC TROMETHAMINE 30 MG/ML IJ SOLN
INTRAMUSCULAR | Status: DC | PRN
  Administered 2021-09-02: 30 mg via INTRAVENOUS

## 2021-09-02 MED ORDER — FENTANYL CITRATE (PF) 100 MCG/2ML IJ SOLN: 25.0000 ug | INTRAMUSCULAR | Status: DC | PRN

## 2021-09-02 SURGICAL SUPPLY — 22 items
CATH ROBINSON RED A/P 16FR (CATHETERS) ×2 IMPLANT
FILTER UTR ASPR ASSEMBLY (MISCELLANEOUS) ×2 IMPLANT
GLOVE BIO SURGEON STRL SZ 6.5 (GLOVE) ×2 IMPLANT
GLOVE BIOGEL PI IND STRL 6.5 (GLOVE) ×1 IMPLANT
GLOVE BIOGEL PI IND STRL 7.0 (GLOVE) ×1 IMPLANT
GLOVE BIOGEL PI INDICATOR 6.5 (GLOVE) ×1
GLOVE BIOGEL PI INDICATOR 7.0 (GLOVE) ×1
GOWN STRL REUS W/ TWL LRG LVL3 (GOWN DISPOSABLE) ×2 IMPLANT
GOWN STRL REUS W/TWL LRG LVL3 (GOWN DISPOSABLE) ×4
HOSE CONNECTING 18IN BERKELEY (TUBING) ×2 IMPLANT
KIT BERKELEY 1ST TRI 3/8 NO TR (MISCELLANEOUS) ×2 IMPLANT
KIT BERKELEY 1ST TRIMESTER 3/8 (MISCELLANEOUS) ×2 IMPLANT
NS IRRIG 1000ML POUR BTL (IV SOLUTION) ×2 IMPLANT
PACK VAGINAL MINOR WOMEN LF (CUSTOM PROCEDURE TRAY) ×2 IMPLANT
PAD OB MATERNITY 4.3X12.25 (PERSONAL CARE ITEMS) ×2 IMPLANT
SET BERKELEY SUCTION TUBING (SUCTIONS) ×2 IMPLANT
SPIKE FLUID TRANSFER (MISCELLANEOUS) ×2 IMPLANT
TOWEL GREEN STERILE FF (TOWEL DISPOSABLE) ×4 IMPLANT
VACURETTE 10 RIGID CVD (CANNULA) IMPLANT
VACURETTE 7MM CVD STRL WRAP (CANNULA) ×1 IMPLANT
VACURETTE 8 RIGID CVD (CANNULA) IMPLANT
VACURETTE 9 RIGID CVD (CANNULA) IMPLANT

## 2021-09-02 NOTE — ED Provider Notes (Signed)
[facility] EMERGENCY DEPARTMENT Provider Note   CSN: [id number] Arrival date & time: 09/02/21  6440     History  Chief Complaint  Patient presents with   Abdominal Cramping    Diana Burke is a 26 y.o. female.  The patient presents to the emergency department today complaining of abdominal cramping and vaginal bleeding.  The patient states that on Tuesday she had noticed increased bleeding and went to the MAU where it was found that she was having spontaneous abortion.  She was sent to her provider outpatient on Wednesday and was prescribed misoprostol.  She has also been taking oxycodone, Zofran, ibuprofen for symptoms.  The patient states that since taking medication on Wednesday she has been having severe cramping with pain.  She has been passing clots.  The patient was approximately 11 weeks 3 days at the time of the visit.  The patient denies shortness of breath, chest pain, headache.  Endorses abdominal cramping and pain, vaginal bleeding.  Patient with past medical history of anemia and hypertension  HPI     Home Medications Prior to Admission medications   Medication Sig Start Date End Date Taking? Authorizing Provider  Prenatal Vit-Fe Fumarate-FA (PRENATAL MULTIVITAMIN) TABS tablet Take 1 tablet by mouth daily at 12 noon.    [provider]  promethazine (PHENERGAN) 25 MG tablet Take 1 tablet (25 mg total) by mouth every 6 (six) hours as needed for nausea or vomiting. 08/29/21   Donette Aleksander Edmiston, CNM  scopolamine (TRANSDERM-SCOP) 1 MG/3DAYS Place 1 patch (1.5 mg total) onto the skin every 3 (three) days. 09/01/21   Donette Kyleeann Cremeans, CNM      Allergies    Carrot [daucus carota] and Peanut-containing drug products    Review of Systems   Review of Systems  Constitutional:  Negative for fever.  Respiratory:  Negative for shortness of breath.   Cardiovascular:  Negative for chest pain.  Gastrointestinal:  Positive for abdominal pain. Negative for nausea  and vomiting.  Genitourinary:  Positive for pelvic pain, vaginal bleeding and vaginal discharge.    Physical Exam Updated Vital Signs BP 132/88   Pulse 82   Temp 97.6 F (36.4 C) (Oral)   Resp 18   LMP 06/10/2021 (Exact Date)   SpO2 98%   Breastfeeding Unknown Comment: miscarriage Physical Exam Vitals and nursing note reviewed. Exam conducted with a chaperone present.  Constitutional:      General: She is in acute distress.  HENT:     Head: Normocephalic.  Eyes:     Conjunctiva/sclera: Conjunctivae normal.  Cardiovascular:     Rate and Rhythm: Regular rhythm. Tachycardia present.     Pulses: Normal pulses.     Heart sounds: Normal heart sounds.  Pulmonary:     Effort: No respiratory distress.     Breath sounds: Normal breath sounds.     Comments: Patient tachypneic Genitourinary:    Comments: Clot noted at cervical OS. No significant active bleeding. No discharge noted Musculoskeletal:     Cervical back: Normal range of motion.  Skin:    General: Skin is warm and dry.     Coloration: Skin is not pale.  Neurological:     Mental Status: She is alert and oriented to person, place, and time.     ED Results / Procedures / Treatments   Labs (all labs ordered are listed, but only abnormal results are displayed) Labs Reviewed  BASIC METABOLIC PANEL - Abnormal; Notable for the following components:  Result Value   CO2 21 (*)    All other components within normal limits  CBC WITH DIFFERENTIAL/PLATELET - Abnormal; Notable for the following components:   WBC 11.0 (*)    All other components within normal limits    EKG None  Radiology US PELVIC COMPLETE WITH TRANSVAGINAL  Result Date: 09/02/2021 CLINICAL DATA:  Missed abortion. EXAM: TRANSABDOMINAL AND TRANSVAGINAL ULTRASOUND OF PELVIS TECHNIQUE: Both transabdominal and transvaginal ultrasound examinations of the pelvis were performed. Transabdominal technique was performed for global imaging of the pelvis  including uterus, ovaries, adnexal regions, and pelvic cul-de-sac. It was necessary to proceed with endovaginal exam following the transabdominal exam to visualize the endometrium. COMPARISON:  Ob ultrasound 08/29/2021. FINDINGS: Uterus Measurements: 8.8 x 5.2 x 6.6 = volume: 160 mL. Heterogeneous material is present within the endometrial cavity. Scattered foci of flow noted. Right ovary Unable to visualize due to pain. Left ovary Unable to visualize due to pain Other findings Trace free fluid is present. IMPRESSION: 1. Heterogeneous material within the endometrial cavity is concerning for retained products of conception. 2. Unable to image the ovaries do to patient pain. Electronically Signed   By: Marin Roberts M.D.   On: 09/02/2021 12:21    Procedures Procedures    Medications Ordered in ED Medications  HYDROmorphone (DILAUDID) injection 1 mg (1 mg Intravenous Given 09/02/21 0909)  ondansetron (ZOFRAN) injection 4 mg (4 mg Intravenous Given 09/02/21 0909)  HYDROmorphone (DILAUDID) injection 1 mg (1 mg Intravenous Given 09/02/21 0923)  lactated ringers bolus 1,000 mL (1,000 mLs Intravenous New Bag/Given 09/02/21 0933)  ketorolac (TORADOL) 30 MG/ML injection 30 mg (30 mg Intravenous Given 09/02/21 0937)  HYDROmorphone (DILAUDID) injection 1 mg (1 mg Intravenous Given 09/02/21 1110)  lactated ringers bolus 1,000 mL (1,000 mLs Intravenous New Bag/Given 09/02/21 1332)  HYDROmorphone (DILAUDID) injection 1 mg (1 mg Intravenous Given 09/02/21 1417)    ED Course/ Medical Decision Making/ A&P                           Medical Decision Making Amount and/or Complexity of Data Reviewed Labs: ordered. Radiology: ordered.  Risk Prescription drug management. Decision regarding hospitalization.   The patient presents with a chief complaint of abdominal cramping secondary to miscarriage.  I reviewed the patient's history including note from the MAU from the 25th showing her visit for miscarriage.   I also reviewed patient's medications including prescription for misoprostol  I ordered and reviewed labs.  Pertinent results include hemoglobin 12.5, WBC 11.0, CO2 21  I ordered and personally interpreted imaging including ultrasound of the pelvis. Heterogeneous material within the endometrial cavity is concerning for retained products of conception.  I agree with the radiologist findings  I ordered the patient Dilaudid and Toradol for pain and inflammation, Zofran for nausea, and a lactated Ringer's bolus.  Upon reassessment the patient's pain had improved significantly.  Her pain returned every 2 hours with some significance and was subsequently treated with 2 more milligrams of Dilaudid.  I discussed the patient's case with Dr. Senaida Ores, OB/GYN, who agreed to accept the patient at the MAU.  It seems likely that the patient is having significant cramping due to retained material after her miscarriage.  The patient would benefit from admission to the MAU for further pain management, evaluation, and possible definitive management.  Transfer to MAU          Final Clinical Impression(s) / ED Diagnoses Final diagnoses:  Abdominal cramping  Retained products of conception after miscarriage    Rx / DC Orders ED Discharge Orders     None         Ronny Bacon 09/02/21 1534    Charlesetta Shanks, MD 09/07/21 [phone]

## 2021-09-02 NOTE — Anesthesia Postprocedure Evaluation (Signed)
Anesthesia Post Note  Patient: Diana Burke  Procedure(s) Performed: DILATATION AND EVACUATION     Patient location during evaluation: PACU Anesthesia Type: MAC Level of consciousness: awake Pain management: pain level controlled Vital Signs Assessment: post-procedure vital signs reviewed and stable Respiratory status: spontaneous breathing, nonlabored ventilation, respiratory function stable and patient connected to nasal cannula oxygen Cardiovascular status: stable and blood pressure returned to baseline Postop Assessment: no apparent nausea or vomiting Anesthetic complications: no   No notable events documented.  Last Vitals:  Vitals:   09/02/21 1900 09/02/21 1915  BP: 103/74 106/75  Pulse: 77 76  Resp: 12 14  Temp:  36.6 C  SpO2: 92% 97%    Last Pain:  Vitals:   09/02/21 1915  TempSrc:   PainSc: 0-No pain                 [name] P Nemesio Castrillon

## 2021-09-02 NOTE — Transfer of Care (Signed)
Immediate Anesthesia Transfer of Care Note  Patient: Diana Burke  Procedure(s) Performed: DILATATION AND EVACUATION  Patient Location: PACU  Anesthesia Type:MAC  Level of Consciousness: awake, alert  and oriented  Airway & Oxygen Therapy: Patient Spontanous Breathing and Patient connected to nasal cannula oxygen  Post-op Assessment: Report given to RN and Post -op Vital signs reviewed and stable  Post vital signs: Reviewed and stable  Last Vitals:  Vitals Value Taken Time  BP 109/75 09/02/21 1830  Temp    Pulse 83 09/02/21 1840  Resp 11 09/02/21 1840  SpO2 97 % 09/02/21 1840  Vitals shown include unvalidated device data.  Last Pain:  Vitals:   09/02/21 1732  TempSrc: Oral  PainSc:          Complications: No notable events documented.

## 2021-09-02 NOTE — MAU Provider Note (Signed)
History     CSN: [id number]  Arrival date and time: 09/02/21 0626   Event Date/Time   First Provider Initiated Contact with Patient 09/02/21 1609      Chief Complaint  Patient presents with   Abdominal Cramping   HPI Diana Endo A. Burke is a 26 y.o. G1P0 at [redacted]w[redacted]d who presents to MAU via [facility] from [facility]. Patient was diagnosed with a missed AB and took cytotec on 7/26. She reports lower abdominal pain and bleeding started after taking cytotec on Wednesday. She reports passing some clots and tissue on Thursday but has continued to have bleeding and pain. Patient's ultrasound at [facility] today is concerning for retained products of conception and plan per patient and ED provider was to discuss D&E.   OB History     Gravida  1   Para      Term      Preterm      AB      Living         SAB      IAB      Ectopic      Multiple      Live Births              Past Medical History:  Diagnosis Date   Anemia    Astigmatism of both eyes 10/02/2012   Elevated BP 10/02/2012   Suspect component of "White-coat" hypertension and contibution from obesity. Discussedwithpatient and mother the role of her weight in this BP elevation. Patient thought she would enjoy walking which was endorsed as a good plan. Declined nutrition consult for now.  Will recheck BP in 3 months along with weight.     History reviewed. No pertinent surgical history.  Family History  Problem Relation Age of Onset   Cancer Paternal Grandmother     Social History   Tobacco Use   Smoking status: Never   Smokeless tobacco: Never  Vaping Use   Vaping Use: Never used  Substance Use Topics   Alcohol use: Not Currently   Drug use: Not Currently    Allergies:  Allergies  Allergen Reactions   Carrot [Daucus Carota]    Peanut-Containing Drug Products     Medications Prior to Admission  Medication Sig Dispense Refill Last Dose   Prenatal Vit-Fe Fumarate-FA (PRENATAL MULTIVITAMIN) TABS tablet Take 1  tablet by mouth daily at 12 noon.      promethazine (PHENERGAN) 25 MG tablet Take 1 tablet (25 mg total) by mouth every 6 (six) hours as needed for nausea or vomiting. 30 tablet 0    scopolamine (TRANSDERM-SCOP) 1 MG/3DAYS Place 1 patch (1.5 mg total) onto the skin every 3 (three) days. 10 patch 12    Review of Systems  Constitutional: Negative.   Respiratory: Negative.    Cardiovascular: Negative.   Gastrointestinal:  Positive for abdominal pain and nausea.  Genitourinary:  Positive for vaginal bleeding.  Musculoskeletal: Negative.   Neurological: Negative.    Physical Exam   Blood pressure 126/83, pulse 89, temperature 98.2 F (36.8 C), temperature source Oral, resp. rate 18, last menstrual period 06/10/2021, SpO2 98 %, unknown if currently breastfeeding.  Physical Exam Vitals and nursing note reviewed. Exam conducted with a chaperone present.  Constitutional:      General: She is not in acute distress. Eyes:     Extraocular Movements: Extraocular movements intact.     Pupils: Pupils are equal, round, and reactive to light.  Cardiovascular:     Rate and Rhythm:  Normal rate.  Pulmonary:     Effort: Pulmonary effort is normal.  Abdominal:     Palpations: Abdomen is soft.     Tenderness: There is no abdominal tenderness.  Genitourinary:    Comments: Normal external female genitalia, vaginal walls pink with rugae, bleeding small, cervix visually dilated to approximately 1cm Musculoskeletal:        General: Normal range of motion.     Cervical back: Normal range of motion.  Skin:    General: Skin is warm and dry.  Neurological:     General: No focal deficit present.     Mental Status: She is alert and oriented to person, place, and time.  Psychiatric:        Mood and Affect: Mood is anxious.    MAU Course  Procedures  MDM Upon arrival to MAU, patient reports that she passed a large clot. Appears c/w POC's. Speculum exam performed, bleeding minimal and cervix visually  dilated to 1cm. Patient still writhing in pain. Dr. Senaida Ores was made aware of patient's arrival and will come to speak with patient. Patient likely will go to OR for D&E.   Assessment and Plan  Missed AB  - Dr. Senaida Ores to assume care of patient   Brand Males 09/02/2021, 5:21 PM

## 2021-09-02 NOTE — Op Note (Signed)
Operative Note    Preoperative Diagnosis Incomplete miscarriage at 9 weeks CRL  Postoperative Diagnosis same  Procedure Suction dilation and curettage  Surgeon Paula Compton, MD  Anesthesia MAC  Fluids: EBL 86m UOP 1058mstraight cath prior to procedure IVF 8005mFindings The uterus was still enlarged to about 8 weeks size and the cervix open about 1cm.  A moderate amount of POC's were obtained in several passes.  Specimen Products of conception sent to pathology and for anora testing.  Procedure Note  Pt was taken to operating room where MAC anesthesia was obtained without difficulty.  She was prepped and draped in the normal sterile fashion in the dorsal lithotomy position.  An appropriate timeout was performed.  A speculum was placed in the vagina and 2cc of 1% plain lidocaine injected into the anterior cervix.  An additional 9cc was placed both at 2 and 10 o'clock for a paracervical block.  A single tooth tenaculum was placed on the anterior lip of the cervix. The cervix was slightly dilated from the pt's cytotec treatment and  sound was easily introduced into the uterus and the uterus sounded to about 8cm.  The 8mm22mction currette was obtained and introduced into the fundus.  With several passes, a moderate amount of tissue was obtained.  When no further tissue was noted, the suction was discontinued and a gentle currettage performed.  One additional pass revealed no further tissue, and the procedure was completed.  The tenaculum was removed and the site hemostatic.   All instruments and sponges were removed from vagina and the patient awakened and taken to the PACU in good condition.

## 2021-09-02 NOTE — MAU Note (Signed)
Pt to OR with transport at 1710

## 2021-09-02 NOTE — MAU Note (Signed)
CHG wipes completed. IV  20gu in rt AC< flushed, LR infusing. Below the knee SCDs in place. Unable to remove nose ring and nipple piercings, taped in place. [employer] piercing and yellow metal chain w/cross removed, placed in plastic cup- has ID sticker on  it.  Placed with crocs, top and pants in belonging bag

## 2021-09-02 NOTE — ED Notes (Signed)
Dilaudid administered in U/s, no scanner avail when medication administered, witnessed by Lawson Fiscal, [place] tech that med was administered and patient verified

## 2021-09-02 NOTE — ED Provider Notes (Signed)
I provided a substantive portion of the care of this patient.  I personally performed the entirety of the medical decision making for this encounter. {Remember to document shared critical care using "edcritical" dot phrase:1}    Patient presents with severe abdominal pain and cramping.  Patient had a spontaneous AB at 11.3 weeks with retained products of conception.  She was seen at MAU 7\25 to follow-up with [facility].  From [facility] OB she was prescribed misoprostol intravaginally.  Today patient reports she has severe cramping and has been passing some clots.  Patient is in severe pain.  Patient is having severe pain alert and appropriate.  No respiratory distress.  Heart regular.  Abdomen soft but very tender in the lower abdomen.  Speculum exam shows about a 5 cm clot high in the vaginal vault.  No pooling brisk bleeding.  Pain control with Dilaudid and Toradol.  Patient required 2 doses of Dilaudid and a dose of Toradol for some pain control.  At that point able to proceed with pelvic exam.  PA-C Macauley discussed with MAU provider.

## 2021-09-02 NOTE — ED Notes (Signed)
Patient states that she used two pad within an hour before coming to to the hospital. States that she has had several clots. Alert x4 Pain is 10/10 in abdominal area .

## 2021-09-02 NOTE — H&P (Addendum)
Diana Burke is an 26 y.o. female G2P0 presenting from [facility] ED with an incomplete miscarriage in process.  She used misoprostol she was prescribed for a 9 week missed AB and began bleeding.  SHe did pass some tissue at home and here on arrival but remains extremely uncomfortable and has required multiple doses of dilaudid.    Pertinent Gynecological History:  OB History: SAB x 2   Menstrual History:  Patient's last menstrual period was 06/10/2021 (exact date).    Past Medical History:  Diagnosis Date   Anemia    Astigmatism of both eyes 10/02/2012   Elevated BP 10/02/2012   Suspect component of "White-coat" hypertension and contibution from obesity. Discussedwithpatient and mother the role of her weight in this BP elevation. Patient thought she would enjoy walking which was endorsed as a good plan. Declined nutrition consult for now.  Will recheck BP in 3 months along with weight.     History reviewed. No pertinent surgical history.  Family History  Problem Relation Age of Onset   Cancer Paternal Grandmother     Social History:  reports that she has never smoked. She has never used smokeless tobacco. She reports that she does not currently use alcohol. She reports that she does not currently use drugs.  Allergies:  Allergies  Allergen Reactions   Carrot [Daucus Carota]    Peanut-Containing Drug Products     Medications Prior to Admission  Medication Sig Dispense Refill Last Dose   Prenatal Vit-Fe Fumarate-FA (PRENATAL MULTIVITAMIN) TABS tablet Take 1 tablet by mouth daily at 12 noon.      promethazine (PHENERGAN) 25 MG tablet Take 1 tablet (25 mg total) by mouth every 6 (six) hours as needed for nausea or vomiting. 30 tablet 0    scopolamine (TRANSDERM-SCOP) 1 MG/3DAYS Place 1 patch (1.5 mg total) onto the skin every 3 (three) days. 10 patch 12     Review of Systems  Gastrointestinal:  Positive for abdominal pain.  Genitourinary:  Positive for vaginal bleeding.     Blood pressure (!) 148/99, pulse 91, temperature 98.2 F (36.8 C), temperature source Oral, resp. rate 18, last menstrual period 06/10/2021, SpO2 98 %, unknown if currently breastfeeding. Physical Exam Constitutional:      Appearance: She is ill-appearing.  Cardiovascular:     Rate and Rhythm: Normal rate and regular rhythm.  Pulmonary:     Effort: Pulmonary effort is normal.  Abdominal:     Palpations: Abdomen is soft.  Genitourinary:    General: Normal vulva.  Psychiatric:        Mood and Affect: Mood normal.     Results for orders placed or performed during the hospital encounter of 09/02/21 (from the past 24 hour(s))  Basic metabolic panel     Status: Abnormal   Collection Time: 09/02/21  9:07 AM  Result Value Ref Range   Sodium 138 135 - 145 mmol/L   Potassium 3.6 3.5 - 5.1 mmol/L   Chloride 106 98 - 111 mmol/L   CO2 21 (L) 22 - 32 mmol/L   Glucose, Bld 96 70 - 99 mg/dL   BUN 13 6 - 20 mg/dL   Creatinine, Ser 2.70 0.44 - 1.00 mg/dL   Calcium 9.4 8.9 - 78.6 mg/dL   GFR, Estimated >75 >44 mL/min   Anion gap 11 5 - 15  CBC with Differential     Status: Abnormal   Collection Time: 09/02/21  9:07 AM  Result Value Ref Range   WBC  11.0 (H) 4.0 - 10.5 K/uL   RBC 4.51 3.87 - 5.11 MIL/uL   Hemoglobin 12.5 12.0 - 15.0 g/dL   HCT 50.5 39.7 - 67.3 %   MCV 83.1 80.0 - 100.0 fL   MCH 27.7 26.0 - 34.0 pg   MCHC 33.3 30.0 - 36.0 g/dL   RDW 41.9 37.9 - 02.4 %   Platelets 338 150 - 400 K/uL   nRBC 0.0 0.0 - 0.2 %   Neutrophils Relative % 68 %   Neutro Abs 7.6 1.7 - 7.7 K/uL   Lymphocytes Relative 23 %   Lymphs Abs 2.5 0.7 - 4.0 K/uL   Monocytes Relative 6 %   Monocytes Absolute 0.6 0.1 - 1.0 K/uL   Eosinophils Relative 2 %   Eosinophils Absolute 0.2 0.0 - 0.5 K/uL   Basophils Relative 1 %   Basophils Absolute 0.1 0.0 - 0.1 K/uL   Immature Granulocytes 0 %   Abs Immature Granulocytes 0.03 0.00 - 0.07 K/uL    US PELVIC COMPLETE WITH TRANSVAGINAL  Result Date:  09/02/2021 CLINICAL DATA:  Missed abortion. EXAM: TRANSABDOMINAL AND TRANSVAGINAL ULTRASOUND OF PELVIS TECHNIQUE: Both transabdominal and transvaginal ultrasound examinations of the pelvis were performed. Transabdominal technique was performed for global imaging of the pelvis including uterus, ovaries, adnexal regions, and pelvic cul-de-sac. It was necessary to proceed with endovaginal exam following the transabdominal exam to visualize the endometrium. COMPARISON:  Ob ultrasound 08/29/2021. FINDINGS: Uterus Measurements: 8.8 x 5.2 x 6.6 = volume: 160 mL. Heterogeneous material is present within the endometrial cavity. Scattered foci of flow noted. Right ovary Unable to visualize due to pain. Left ovary Unable to visualize due to pain Other findings Trace free fluid is present. IMPRESSION: 1. Heterogeneous material within the endometrial cavity is concerning for retained products of conception. 2. Unable to image the ovaries do to patient pain. Electronically Signed   By: Marin Roberts M.D.   On: 09/02/2021 12:21    Assessment/Plan: Pt presenting with incomplete AB and severe discomfort and continued bleeding s/p misoprostol for 9 week missed miscarrriage. D/w pt options and I recommend we proceed with suction D&E.  Risks and benefits of procedure discussed including bleeding, infection and possible uterine perforation.  She did pass one piece of tissue since arriving but continues to bleed and have pain.  OR notified and will proceed as soon as team available.  Consent signed. Pt states did collect some tissue from home and send in for anora testing but looked more like clots than tissue.  We will send specimen from OR.   Oliver Pila 09/02/2021, 4:30 PM

## 2021-09-02 NOTE — ED Triage Notes (Signed)
Pt brought in by husband and mother. Reports miscarriage on Tuesday and was given medication. Pt crying and shaking in pain. Reports severe cramping and bleeding with clots and white matter

## 2021-09-02 NOTE — ED Notes (Signed)
Patient took misoprostol 200 mcg 4 tabs on Wednesday. States that she was having really bad cramping in her abdominal area today that that she was bleeding really heavy

## 2021-09-02 NOTE — Anesthesia Preprocedure Evaluation (Addendum)
Anesthesia Evaluation  Patient identified by MRN, date of birth, ID band Patient awake    Reviewed: Allergy & Precautions, NPO status , Patient's Chart, lab work & pertinent test results  Airway Mallampati: II  TM Distance: >3 FB Neck ROM: Full    Dental no notable dental hx.  Braces:   Pulmonary neg pulmonary ROS,    Pulmonary exam normal        Cardiovascular negative cardio ROS Normal cardiovascular exam     Neuro/Psych negative neurological ROS  negative psych ROS   GI/Hepatic negative GI ROS, Neg liver ROS,   Endo/Other  negative endocrine ROS  Renal/GU negative Renal ROS     Musculoskeletal negative musculoskeletal ROS (+)   Abdominal   Peds  Hematology negative hematology ROS (+)   Anesthesia Other Findings Missed Abortion  Reproductive/Obstetrics                            Anesthesia Physical Anesthesia Plan  ASA: 2 and emergent  Anesthesia Plan: MAC   Post-op Pain Management:    Induction: Intravenous  PONV Risk Score and Plan: 2 and Ondansetron, Dexamethasone, Midazolam, Propofol infusion and Treatment may vary due to age or medical condition  Airway Management Planned: Simple Face Mask  Additional Equipment:   Intra-op Plan:   Post-operative Plan:   Informed Consent: I have reviewed the patients History and Physical, chart, labs and discussed the procedure including the risks, benefits and alternatives for the proposed anesthesia with the patient or authorized representative who has indicated his/her understanding and acceptance.     Dental advisory given  Plan Discussed with: CRNA  Anesthesia Plan Comments:         Anesthesia Quick Evaluation

## 2021-09-03 ENCOUNTER — Encounter (HOSPITAL_COMMUNITY): Payer: Self-pay | Admitting: Obstetrics and Gynecology

## 2021-09-05 LAB — SURGICAL PATHOLOGY

## 2021-12-14 LAB — OB RESULTS CONSOLE GC/CHLAMYDIA
Chlamydia: NEGATIVE
Neisseria Gonorrhea: NEGATIVE

## 2022-01-08 LAB — HEPATITIS C ANTIBODY: HCV Ab: NEGATIVE

## 2022-01-08 LAB — OB RESULTS CONSOLE ABO/RH: RH Type: NEGATIVE

## 2022-01-08 LAB — OB RESULTS CONSOLE RPR: RPR: NONREACTIVE

## 2022-01-08 LAB — OB RESULTS CONSOLE HIV ANTIBODY (ROUTINE TESTING): HIV: NONREACTIVE

## 2022-01-08 LAB — OB RESULTS CONSOLE ANTIBODY SCREEN: Antibody Screen: NEGATIVE

## 2022-01-08 LAB — OB RESULTS CONSOLE RUBELLA ANTIBODY, IGM: Rubella: IMMUNE

## 2022-01-08 LAB — OB RESULTS CONSOLE HEPATITIS B SURFACE ANTIGEN: Hepatitis B Surface Ag: NEGATIVE

## 2022-01-17 ENCOUNTER — Inpatient Hospital Stay (HOSPITAL_COMMUNITY)
Admission: AD | Admit: 2022-01-17 | Discharge: 2022-01-17 | Disposition: A | Discharge status: Home / Self Care | Payer: BC Managed Care – PPO | Attending: Obstetrics | Admitting: Obstetrics

## 2022-01-17 ENCOUNTER — Other Ambulatory Visit: Payer: Self-pay

## 2022-01-17 ENCOUNTER — Encounter (HOSPITAL_COMMUNITY): Payer: Self-pay | Admitting: Obstetrics

## 2022-01-17 DIAGNOSIS — R102 Pelvic and perineal pain: Secondary | ICD-10-CM | POA: Diagnosis not present

## 2022-01-17 DIAGNOSIS — O26891 Other specified pregnancy related conditions, first trimester: Secondary | ICD-10-CM | POA: Insufficient documentation

## 2022-01-17 DIAGNOSIS — Z3A11 11 weeks gestation of pregnancy: Secondary | ICD-10-CM | POA: Diagnosis not present

## 2022-01-17 DIAGNOSIS — S3991XA Unspecified injury of abdomen, initial encounter: Secondary | ICD-10-CM

## 2022-01-17 DIAGNOSIS — Z3491 Encounter for supervision of normal pregnancy, unspecified, first trimester: Secondary | ICD-10-CM | POA: Diagnosis not present

## 2022-01-17 LAB — URINALYSIS, ROUTINE W REFLEX MICROSCOPIC
Bilirubin Urine: NEGATIVE
Glucose, UA: NEGATIVE mg/dL
Hgb urine dipstick: NEGATIVE
Ketones, ur: 20 mg/dL — AB
Leukocytes,Ua: NEGATIVE
Nitrite: NEGATIVE
Protein, ur: NEGATIVE mg/dL
Specific Gravity, Urine: 1.026 (ref 1.005–1.030)
pH: 5 (ref 5.0–8.0)

## 2022-01-17 LAB — POCT PREGNANCY, URINE: Preg Test, Ur: POSITIVE — AB

## 2022-01-17 NOTE — MAU Provider Note (Signed)
Event Date/Time  First Provider Initiated Contact with Patient 01/17/22 1817     Diana Burke is a 26 y.o. G2P0010 patient who presents to MAU today for evaluation following abdominal trauma at work. Patient states at noon today her mid abdomen "ran into" the edge of a box. She endorses periumbilical pain with pain score of 4/10. She denies abdominal tenderness, abnormal vaginal discharge and vaginal bleeding. She is very concerned that her baby might have been harmed.  O BP 136/88 (BP Location: Right Arm)   Pulse (!) 116   Temp 99.1 F (37.3 C) (Oral)   Resp 16   Ht 5\' 8"  (1.727 m)   Wt 111.4 kg   LMP 06/10/2021 (Exact Date)   SpO2 100% Comment: room air  BMI 37.36 kg/m   Physical Exam Vitals and nursing note reviewed.  Constitutional:      Appearance: She is well-developed.  Cardiovascular:     Rate and Rhythm: Normal rate.  Pulmonary:     Effort: Pulmonary effort is normal.  Abdominal:     Palpations: Abdomen is soft.     Tenderness: There is no abdominal tenderness.  Neurological:     Mental Status: She is alert.    A Medical screening exam complete FHT 166 by Doppler Very active fetal movement on BSUS Reassured uterus is still pelvic organ at [redacted]w[redacted]d, significant protection from bony pelvis  P Discharge from MAU in stable condition with return precautions  [redacted]w[redacted]d, MSA, MSN, CNM 01/17/2022 6:45 PM

## 2022-01-17 NOTE — Discharge Instructions (Signed)

## 2022-01-17 NOTE — MAU Note (Signed)
Diana Burke is a 26 y.o. at [redacted]w[redacted]d here in MAU reporting: today she ran into the edge of a box around 1200 and is having pain. No bleeding. Wants to make sure everything is okay with the baby.  Onset of complaint: today  Pain score: 4/10  Vitals:   01/17/22 1800  BP: 136/88  Pulse: (!) 116  Resp: 16  Temp: 99.1 F (37.3 C)  SpO2: 100%     FHT:166  Lab orders placed from triage: ua, upt

## 2022-02-05 NOTE — L&D Delivery Note (Signed)
Delivery Note COLUMBINE ACRE is a G2P0010 at [redacted]w[redacted]d who had a spontaneous delivery at 2129 a viable female Clearance Coots") was delivered via OA.  APGAR: 8, 9; weight 3487g (7lb 11oz)  .     Admitted for induction of labor for chronic hypertension. Induced with cytotec, foley bulb, pitocin, AROM. Progressed normally. Received epidural for pain management. Pushed for 70 minutes. Baby was delivered without difficulty. Tight nuchal cord reduced after delivery of infant body.  Delayed cord clamping for 60 seconds.  Delivery of placenta was spontaneous. Placenta was found to be intact, 3 -vessel cord was noted. The fundus was found to be firm. 2nd degree perineal laceration was repaired in the normal sterile fashion with 3-0 vicryl. Estimated blood loss 212cc. Instrument and gauze counts were correct at the end of the procedure.   Placenta status: patient desires to take placenta home  Anesthesia:  epidural Episiotomy:  none Lacerations:  2nd degree perineal Suture Repair: 3.0 vicryl Est. Blood Loss (mL):  212  Mom to postpartum.  Baby to Couplet care / Skin to Skin.  Charlett Nose 07/23/2022, 10:13 PM

## 2022-04-02 LAB — OB RESULTS CONSOLE RPR: RPR: NONREACTIVE

## 2022-04-03 ENCOUNTER — Inpatient Hospital Stay (HOSPITAL_COMMUNITY): Payer: BC Managed Care – PPO

## 2022-04-03 ENCOUNTER — Other Ambulatory Visit: Payer: Self-pay

## 2022-04-03 ENCOUNTER — Inpatient Hospital Stay (HOSPITAL_COMMUNITY)
Admission: AD | Admit: 2022-04-03 | Discharge: 2022-04-03 | Disposition: A | Discharge status: Home / Self Care | Payer: BC Managed Care – PPO | Attending: Obstetrics | Admitting: Obstetrics

## 2022-04-03 ENCOUNTER — Encounter (HOSPITAL_COMMUNITY): Payer: Self-pay | Admitting: Obstetrics

## 2022-04-03 DIAGNOSIS — Y93K1 Activity, walking an animal: Secondary | ICD-10-CM | POA: Insufficient documentation

## 2022-04-03 DIAGNOSIS — Z3A22 22 weeks gestation of pregnancy: Secondary | ICD-10-CM

## 2022-04-03 DIAGNOSIS — W19XXXA Unspecified fall, initial encounter: Secondary | ICD-10-CM

## 2022-04-03 DIAGNOSIS — S93401A Sprain of unspecified ligament of right ankle, initial encounter: Secondary | ICD-10-CM | POA: Insufficient documentation

## 2022-04-03 DIAGNOSIS — O9A212 Injury, poisoning and certain other consequences of external causes complicating pregnancy, second trimester: Secondary | ICD-10-CM | POA: Diagnosis present

## 2022-04-03 DIAGNOSIS — O99212 Obesity complicating pregnancy, second trimester: Secondary | ICD-10-CM | POA: Diagnosis not present

## 2022-04-03 DIAGNOSIS — E669 Obesity, unspecified: Secondary | ICD-10-CM | POA: Diagnosis not present

## 2022-04-03 DIAGNOSIS — O10912 Unspecified pre-existing hypertension complicating pregnancy, second trimester: Secondary | ICD-10-CM | POA: Insufficient documentation

## 2022-04-03 NOTE — Discharge Instructions (Signed)
ONLY TYLENOL IN PREGNANCY

## 2022-04-03 NOTE — MAU Provider Note (Signed)
History     CSN: [id number]  Arrival date and time: 04/03/22 [id number]   Event Date/Time   First Provider Initiated Contact with Patient 04/03/22 [phone]      Chief Complaint  Patient presents with   Diana Burke , a  27 y.o. G2P0010 at 37w2dpresents to MAU with complaints of right flank pain and right ankle pain after a fall this morning. Patient states she was walking her dog about 0830 and rolled her ankle off the curb and fell. Patient states she landed on her right hip and back. Denies hitting abdomen and blunt abdominal trauma. She states her right hip and back are sore currently rating pain a 2/10. She denies attempting to relieve pain. She states her right ankle hurts worse and is 10/10. Denies attempting to relieve symptoms. She states she can bear weight but is painful. She reports she previously broke her ankle and tore some ligaments, but states this does not hurt as bad as when she broke it. Denies attempting to ice it. She denies abdomina, pain, vaginal bleeding, leaking of fluid and endorses positive fetal movement.          OB History     Gravida  2   Para      Term      Preterm      AB  1   Living         SAB  1   IAB      Ectopic      Multiple      Live Births              Past Medical History:  Diagnosis Date   Anemia    Astigmatism of both eyes 10/02/2012   Elevated BP 10/02/2012   Suspect component of "White-coat" hypertension and contibution from obesity. Discussedwithpatient and mother the role of her weight in this BP elevation. Patient thought she would enjoy walking which was endorsed as a good plan. Declined nutrition consult for now.  Will recheck BP in 3 months along with weight.     Past Surgical History:  Procedure Laterality Date   DILATION AND CURETTAGE OF UTERUS     DILATION AND EVACUATION N/A 09/02/2021   Procedure: DILATATION AND EVACUATION;  Surgeon: RPaula Compton MD;  Location: [facility]  Service: Gynecology;   Laterality: N/A;    Family History  Problem Relation Age of Onset   Cancer Paternal Grandmother     Social History   Tobacco Use   Smoking status: Never   Smokeless tobacco: Never  Vaping Use   Vaping Use: Never used  Substance Use Topics   Alcohol use: Not Currently   Drug use: Not Currently    Allergies:  Allergies  Allergen Reactions   Carrot [Daucus Carota] Swelling   Peanut-Containing Drug Products Swelling    Medications Prior to Admission  Medication Sig Dispense Refill Last Dose   aspirin EC 81 MG tablet Take 81 mg by mouth daily. Swallow whole.   04/03/2022   Prenatal Vit-Fe Fumarate-FA (PRENATAL MULTIVITAMIN) TABS tablet Take 1 tablet by mouth daily at 12 noon.   04/03/2022    Review of Systems  Musculoskeletal:  Positive for joint swelling.  All other systems reviewed and are negative.  Physical Exam   Blood pressure 126/88, pulse (!) 113, temperature 98.5 F (36.9 C), temperature source Oral, resp. rate 18, last menstrual period 06/10/2021, unknown if currently breastfeeding.  Physical Exam Vitals and nursing note  reviewed.  Constitutional:      General: She is not in acute distress.    Appearance: Normal appearance.  HENT:     Head: Normocephalic.  Pulmonary:     Effort: Pulmonary effort is normal.  Musculoskeletal:        General: Swelling and tenderness present.     Cervical back: Normal range of motion.     Right lower leg: Edema present.     Comments: Patient able to bear weight, wiggle toes. She can flex foot towards face with difficulty. She grimaces with touch and movement. Moderate swelling to the outer right ankle.   Skin:    General: Skin is warm and dry.     Capillary Refill: Capillary refill takes less than 2 seconds.  Neurological:     Mental Status: She is alert and oriented to person, place, and time.  Psychiatric:        Mood and Affect: Mood normal.    FHT obtained in triage. Patient reports frequent flutters.   MAU  Course  Procedures Orders Placed This Encounter  Procedures   DG Ankle Complete Right   Apply other splint  DG Ankle Complete Right  Result Date: 04/03/2022 CLINICAL DATA:  Swelling and pain along the lateral ankle along with bruising along the heel. EXAM: RIGHT ANKLE - COMPLETE 3+ VIEW COMPARISON:  07/02/2012 FINDINGS: Plantar calcaneal spur noted. No fracture or acute bony findings. Mild soft tissue swelling overlying the lateral malleolus. IMPRESSION: 1. Mild soft tissue swelling overlying the lateral malleolus. 2. Plantar calcaneal spur. 3. If pain persists despite conservative therapy, MRI may be warranted for further characterization. Electronically Signed   By: Van Clines M.D.   On: 04/03/2022 10:10     MDM - Offered Pain medication, patient declined.  - Low suspicion of ankle fracture. - Imaging consistent with sprain  - Recommendation for RICE method with possible outpatient referral to ortho given hx of fracture and torn ligaments.  - Ortho tech to patient bedside to apply splint.  - plan for discharge.   Assessment and Plan   1. Sprain of right ankle, unspecified ligament, initial encounter   2. Fall, initial encounter   3. [redacted] weeks gestation of pregnancy    - Recommended Rest, Ice, compression, and elevation over the next few days.  - Patient may take Tylenol PRN for pain. Admin instructions provided at patient bedside.  - Worsening signs and return precautions reviewed.  - Patient discharged home in stable condition and may return to MAU as needed.   Jacquiline Doe, MSN CNM  04/03/2022, 10:42 AM

## 2022-04-03 NOTE — Progress Notes (Signed)
Orthopedic Tech Progress Note Patient Details:  Diana Burke 07/21/1995 [id number]  Ortho Devices Type of Ortho Device: ASO Ortho Device/Splint Location: RLE Ortho Device/Splint Interventions: Ordered, Application   Post Interventions Patient Tolerated: Well  Nayali Talerico A Ardie Mclennan 04/03/2022, 11:00 AM

## 2022-04-03 NOTE — MAU Note (Addendum)
Pt fell around 0830, walking her dog. Pain in right side and right ankle. Denies VB or LOF. +FM.

## 2022-06-30 ENCOUNTER — Inpatient Hospital Stay (HOSPITAL_COMMUNITY)
Admission: AD | Admit: 2022-06-30 | Discharge: 2022-07-01 | Disposition: A | Discharge status: Home / Self Care | Payer: BC Managed Care – PPO | Attending: Obstetrics and Gynecology | Admitting: Obstetrics and Gynecology

## 2022-06-30 ENCOUNTER — Encounter (HOSPITAL_COMMUNITY): Payer: Self-pay | Admitting: Obstetrics and Gynecology

## 2022-06-30 DIAGNOSIS — Z3A35 35 weeks gestation of pregnancy: Secondary | ICD-10-CM | POA: Insufficient documentation

## 2022-06-30 DIAGNOSIS — O113 Pre-existing hypertension with pre-eclampsia, third trimester: Secondary | ICD-10-CM | POA: Diagnosis not present

## 2022-06-30 DIAGNOSIS — I10 Essential (primary) hypertension: Secondary | ICD-10-CM | POA: Insufficient documentation

## 2022-06-30 DIAGNOSIS — O10913 Unspecified pre-existing hypertension complicating pregnancy, third trimester: Secondary | ICD-10-CM | POA: Insufficient documentation

## 2022-06-30 HISTORY — DX: Essential (primary) hypertension: I10

## 2022-06-30 NOTE — MAU Note (Signed)
.  Diana Burke is a 27 y.o. at [redacted]w[redacted]d here in MAU reporting high blood pressure. States her pressure was high in the office Thurs for the first time. Pt states she took B/P med when she was 17 or 27yo but not since then. Denies pain of any sort. Reports good FM and denies LOF or VB.  Onset of complaint: today Pain score: 0 Vitals:   06/30/22 2311 06/30/22 2315  BP:  (!) 134/106  Pulse: 86   Resp: 18   Temp: 98 F (36.7 C)   SpO2: 100%      FHT:137 Lab orders placed from triage:

## 2022-06-30 NOTE — MAU Provider Note (Signed)
History     [id number]  Arrival date and time: 06/30/22 2250    Chief Complaint  Patient presents with   Hypertension     HPI Diana Burke is a 27 y.o. G2P0010 at 101w6d who presents for hypertension. Chronic hypertensive, not on meds. Hasn't been on meds since she was a teenager. Has had normal BPs in the office until Thursday. Reports elevated BPs this evening at home. Denies headache, visual disturbance, or epigastric pain. Reports good fetal movement.   --/--/A NEG (07/25 1019)  OB History     Gravida  2   Para      Term      Preterm      AB  1   Living         SAB  1   IAB      Ectopic      Multiple      Live Births              Past Medical History:  Diagnosis Date   Anemia    Astigmatism of both eyes 10/02/2012   Hypertension     Past Surgical History:  Procedure Laterality Date   DILATION AND EVACUATION N/A 09/02/2021   Procedure: DILATATION AND EVACUATION;  Surgeon: Huel Cote, MD;  Location: [facility] OR;  Service: Gynecology;  Laterality: N/A;    Family History  Problem Relation Age of Onset   Cancer Paternal Grandmother     Social History   Socioeconomic History   Marital status: Single    Spouse name: Not on file   Number of children: Not on file   Years of education: Not on file   Highest education level: Not on file  Occupational History   Not on file  Tobacco Use   Smoking status: Never   Smokeless tobacco: Never  Vaping Use   Vaping Use: Never used  Substance and Sexual Activity   Alcohol use: Not Currently   Drug use: Not Currently   Sexual activity: Yes  Other Topics Concern   Not on file  Social History Narrative   Not on file   Social Determinants of Health   Financial Resource Strain: Not on file  Food Insecurity: Not on file  Transportation Needs: Not on file  Physical Activity: Not on file  Stress: Not on file  Social Connections: Not on file  Intimate Partner Violence: Not on file     Allergies  Allergen Reactions   Carrot [Daucus Carota] Swelling   Peanut-Containing Drug Products Swelling    No current facility-administered medications on file prior to encounter.   Current Outpatient Medications on File Prior to Encounter  Medication Sig Dispense Refill   aspirin EC 81 MG tablet Take 81 mg by mouth daily. Swallow whole.     Prenatal Vit-Fe Fumarate-FA (PRENATAL MULTIVITAMIN) TABS tablet Take 1 tablet by mouth daily at 12 noon.       ROS Pertinent positives and negative per HPI, all others reviewed and negative  Physical Exam   BP (!) 134/96 (BP Location: Left Arm)   Pulse (!) 102   Temp 98 F (36.7 C)   Resp 17   Ht 5\' 8"  (1.727 m)   Wt 115.2 kg   LMP 06/10/2021 (Exact Date)   SpO2 99%   BMI 38.62 kg/m   Patient Vitals for the past 24 hrs:  BP Temp Pulse Resp SpO2 Height Weight  07/01/22 0048 (!) 134/96 -- -- 17 99 % -- --  07/01/22 0045 (!) 134/96 -- (!) 102 -- 99 % -- --  07/01/22 0040 -- -- -- -- 98 % -- --  07/01/22 0035 -- -- -- -- 99 % -- --  07/01/22 0030 (!) 136/91 -- (!) 120 -- 99 % -- --  07/01/22 0020 -- -- -- -- 99 % -- --  07/01/22 0015 (!) 130/92 -- (!) 102 -- 99 % -- --  07/01/22 0010 -- -- -- -- 99 % -- --  07/01/22 0005 -- -- -- -- 98 % -- --  07/01/22 0000 (!) 142/102 -- 100 -- 99 % -- --  06/30/22 2350 -- -- -- -- 99 % -- --  06/30/22 2345 (!) 135/90 -- (!) 103 -- 99 % -- --  06/30/22 2340 -- -- -- -- 99 % -- --  06/30/22 2338 (!) 137/95 -- (!) 110 -- -- -- --  06/30/22 2315 (!) 134/106 -- -- -- -- -- --  06/30/22 2311 -- 98 F (36.7 C) 86 18 100 % 5\' 8"  (1.727 m) 115.2 kg    Physical Exam Vitals and nursing note reviewed.  Constitutional:      General: She is not in acute distress.    Appearance: She is well-developed. She is not ill-appearing.  HENT:     Head: Normocephalic and atraumatic.  Eyes:     General: No scleral icterus.       Right eye: No discharge.        Left eye: No discharge.      Conjunctiva/sclera: Conjunctivae normal.  Pulmonary:     Effort: Pulmonary effort is normal. No respiratory distress.  Neurological:     General: No focal deficit present.     Mental Status: She is alert.  Psychiatric:        Mood and Affect: Mood normal.        Behavior: Behavior normal.      FHT Baseline 130, moderate variability, 15x15 accels, no decels Toco: irregular Cat: 1  Labs Results for orders placed or performed during the hospital encounter of 06/30/22 (from the past 24 hour(s))  Urinalysis, Routine w reflex microscopic -Urine, Clean Catch     Status: None   Collection Time: 06/30/22 11:32 PM  Result Value Ref Range   Color, Urine YELLOW YELLOW   APPearance CLEAR CLEAR   Specific Gravity, Urine 1.019 1.005 - 1.030   pH 6.0 5.0 - 8.0   Glucose, UA NEGATIVE NEGATIVE mg/dL   Hgb urine dipstick NEGATIVE NEGATIVE   Bilirubin Urine NEGATIVE NEGATIVE   Ketones, ur NEGATIVE NEGATIVE mg/dL   Protein, ur NEGATIVE NEGATIVE mg/dL   Nitrite NEGATIVE NEGATIVE   Leukocytes,Ua NEGATIVE NEGATIVE  Protein / creatinine ratio, urine     Status: None   Collection Time: 06/30/22 11:32 PM  Result Value Ref Range   Creatinine, Urine 174 mg/dL   Total Protein, Urine 14 mg/dL   Protein Creatinine Ratio 0.08 0.00 - 0.15 mg/mg[Cre]  CBC     Status: Abnormal   Collection Time: 06/30/22 11:55 PM  Result Value Ref Range   WBC 10.4 4.0 - 10.5 K/uL   RBC 3.91 3.87 - 5.11 MIL/uL   Hemoglobin 10.2 (L) 12.0 - 15.0 g/dL   HCT 16.1 (L) 09.6 - 04.5 %   MCV 82.4 80.0 - 100.0 fL   MCH 26.1 26.0 - 34.0 pg   MCHC 31.7 30.0 - 36.0 g/dL   RDW 40.9 81.1 - 91.4 %   Platelets 293 150 - 400 K/uL  nRBC 0.0 0.0 - 0.2 %  Comprehensive metabolic panel     Status: Abnormal   Collection Time: 06/30/22 11:55 PM  Result Value Ref Range   Sodium 133 (L) 135 - 145 mmol/L   Potassium 3.6 3.5 - 5.1 mmol/L   Chloride 100 98 - 111 mmol/L   CO2 21 (L) 22 - 32 mmol/L   Glucose, Bld 93 70 - 99 mg/dL   BUN 9 6  - 20 mg/dL   Creatinine, Ser 1.61 0.44 - 1.00 mg/dL   Calcium 9.1 8.9 - 09.6 mg/dL   Total Protein 6.4 (L) 6.5 - 8.1 g/dL   Albumin 2.8 (L) 3.5 - 5.0 g/dL   AST 15 15 - 41 U/L   ALT 15 0 - 44 U/L   Alkaline Phosphatase 133 (H) 38 - 126 U/L   Total Bilirubin 0.6 0.3 - 1.2 mg/dL   GFR, Estimated >04 >54 mL/min   Anion gap 12 5 - 15    Imaging No results found.  MAU Course  Procedures Lab Orders         Urinalysis, Routine w reflex microscopic -Urine, Clean Catch         CBC         Comprehensive metabolic panel         Protein / creatinine ratio, urine    Meds ordered this encounter  Medications   labetalol (NORMODYNE) 100 MG tablet    Sig: Take 1 tablet (100 mg total) by mouth 2 (two) times daily.    Dispense:  60 tablet    Refill:  0    Order Specific Question:   Supervising Provider    Answer:   Adam Phenix [[id number]]   Imaging Orders  No imaging studies ordered today    MDM moderate  Assessment and Plan   1. Chronic hypertension complicating or reason for care during pregnancy, third trimester   2. [redacted] weeks gestation of pregnancy    -Reviewed patient's prenatal records. Has scheduled induction at 38 weeks due to chronic hypertension. Has been normotensive during prenatal care visits. This evening she has persistently elevated BPs - none severe range. She is asymptomatic with normal preeclampsia labs. DBPs persistently >90. Reviewed with Dr. Debroah Loop - will start on labetalol 100 BID. Patient has appt with OB on Tuesday. Reviewed preeclampsia precautions & reasons to return to MAU  #FWB: reactive nst    Dispo: discharged to home in stable condition.   Discharge Instructions     Discharge patient   Complete by: As directed    Discharge disposition: 01-Home or Self Care   Discharge patient date: 07/01/2022       Judeth Horn, NP 07/01/22 2:04 AM  Allergies as of 07/01/2022       Reactions   Carrot [daucus Carota] Swelling   Peanut-containing Drug  Products Swelling        Medication List     TAKE these medications    aspirin EC 81 MG tablet Take 81 mg by mouth daily. Swallow whole.   labetalol 100 MG tablet Commonly known as: NORMODYNE Take 1 tablet (100 mg total) by mouth 2 (two) times daily.   prenatal multivitamin Tabs tablet Take 1 tablet by mouth daily at 12 noon.

## 2022-07-01 DIAGNOSIS — O10913 Unspecified pre-existing hypertension complicating pregnancy, third trimester: Secondary | ICD-10-CM

## 2022-07-01 DIAGNOSIS — Z3A35 35 weeks gestation of pregnancy: Secondary | ICD-10-CM

## 2022-07-01 LAB — COMPREHENSIVE METABOLIC PANEL
ALT: 15 U/L (ref 0–44)
AST: 15 U/L (ref 15–41)
Albumin: 2.8 g/dL — ABNORMAL LOW (ref 3.5–5.0)
Alkaline Phosphatase: 133 U/L — ABNORMAL HIGH (ref 38–126)
Anion gap: 12 (ref 5–15)
BUN: 9 mg/dL (ref 6–20)
CO2: 21 mmol/L — ABNORMAL LOW (ref 22–32)
Calcium: 9.1 mg/dL (ref 8.9–10.3)
Chloride: 100 mmol/L (ref 98–111)
Creatinine, Ser: 0.6 mg/dL (ref 0.44–1.00)
GFR, Estimated: >60 mL/min (ref >60)
Glucose, Bld: 93 mg/dL (ref 70–99)
Potassium: 3.6 mmol/L (ref 3.5–5.1)
Sodium: 133 mmol/L — ABNORMAL LOW (ref 135–145)
Total Bilirubin: 0.6 mg/dL (ref 0.3–1.2)
Total Protein: 6.4 g/dL — ABNORMAL LOW (ref 6.5–8.1)

## 2022-07-01 LAB — URINALYSIS, ROUTINE W REFLEX MICROSCOPIC
Bilirubin Urine: NEGATIVE
Glucose, UA: NEGATIVE mg/dL
Hgb urine dipstick: NEGATIVE
Ketones, ur: NEGATIVE mg/dL
Leukocytes,Ua: NEGATIVE
Nitrite: NEGATIVE
Protein, ur: NEGATIVE mg/dL
Specific Gravity, Urine: 1.019 (ref 1.005–1.030)
pH: 6 (ref 5.0–8.0)

## 2022-07-01 LAB — CBC
HCT: 32.2 % — ABNORMAL LOW (ref 36.0–46.0)
Hemoglobin: 10.2 g/dL — ABNORMAL LOW (ref 12.0–15.0)
MCH: 26.1 pg (ref 26.0–34.0)
MCHC: 31.7 g/dL (ref 30.0–36.0)
MCV: 82.4 fL (ref 80.0–100.0)
Platelets: 293 10*3/uL (ref 150–400)
RBC: 3.91 MIL/uL (ref 3.87–5.11)
RDW: 13.1 % (ref 11.5–15.5)
WBC: 10.4 10*3/uL (ref 4.0–10.5)
nRBC: 0 % (ref 0.0–0.2)

## 2022-07-01 LAB — PROTEIN / CREATININE RATIO, URINE
Creatinine, Urine: 174 mg/dL
Protein Creatinine Ratio: 0.08 mg/mg{Cre} (ref 0.00–0.15)
Total Protein, Urine: 14 mg/dL

## 2022-07-01 MED ORDER — LABETALOL HCL 100 MG PO TABS: 100.0000 mg | ORAL_TABLET | Freq: Two times a day (BID) | ORAL | 0 refills | Status: DC

## 2022-07-10 ENCOUNTER — Other Ambulatory Visit: Payer: Self-pay | Admitting: Obstetrics and Gynecology

## 2022-07-12 LAB — OB RESULTS CONSOLE GBS: GBS: NEGATIVE

## 2022-07-18 ENCOUNTER — Encounter (HOSPITAL_COMMUNITY): Payer: Self-pay | Admitting: Obstetrics

## 2022-07-18 ENCOUNTER — Telehealth (HOSPITAL_COMMUNITY): Payer: Self-pay | Admitting: *Deleted

## 2022-07-18 ENCOUNTER — Inpatient Hospital Stay (HOSPITAL_COMMUNITY)
Admission: AD | Admit: 2022-07-18 | Discharge: 2022-07-18 | Disposition: A | Discharge status: Home / Self Care | Payer: BC Managed Care – PPO | Attending: Obstetrics | Admitting: Obstetrics

## 2022-07-18 DIAGNOSIS — O10913 Unspecified pre-existing hypertension complicating pregnancy, third trimester: Secondary | ICD-10-CM

## 2022-07-18 DIAGNOSIS — O26893 Other specified pregnancy related conditions, third trimester: Secondary | ICD-10-CM | POA: Diagnosis present

## 2022-07-18 DIAGNOSIS — O10919 Unspecified pre-existing hypertension complicating pregnancy, unspecified trimester: Secondary | ICD-10-CM

## 2022-07-18 DIAGNOSIS — Z3A37 37 weeks gestation of pregnancy: Secondary | ICD-10-CM | POA: Diagnosis not present

## 2022-07-18 DIAGNOSIS — R519 Headache, unspecified: Secondary | ICD-10-CM | POA: Diagnosis not present

## 2022-07-18 LAB — COMPREHENSIVE METABOLIC PANEL
ALT: 14 U/L (ref 0–44)
AST: 19 U/L (ref 15–41)
Albumin: 2.8 g/dL — ABNORMAL LOW (ref 3.5–5.0)
Alkaline Phosphatase: 164 U/L — ABNORMAL HIGH (ref 38–126)
Anion gap: 8 (ref 5–15)
BUN: <5 mg/dL — ABNORMAL LOW (ref 6–20)
CO2: 21 mmol/L — ABNORMAL LOW (ref 22–32)
Calcium: 8.8 mg/dL — ABNORMAL LOW (ref 8.9–10.3)
Chloride: 104 mmol/L (ref 98–111)
Creatinine, Ser: 0.63 mg/dL (ref 0.44–1.00)
GFR, Estimated: >60 mL/min (ref >60)
Glucose, Bld: 81 mg/dL (ref 70–99)
Potassium: 3.8 mmol/L (ref 3.5–5.1)
Sodium: 133 mmol/L — ABNORMAL LOW (ref 135–145)
Total Bilirubin: 0.2 mg/dL — ABNORMAL LOW (ref 0.3–1.2)
Total Protein: 6.6 g/dL (ref 6.5–8.1)

## 2022-07-18 LAB — URINALYSIS, ROUTINE W REFLEX MICROSCOPIC
Bilirubin Urine: NEGATIVE
Glucose, UA: NEGATIVE mg/dL
Hgb urine dipstick: NEGATIVE
Ketones, ur: NEGATIVE mg/dL
Leukocytes,Ua: NEGATIVE
Nitrite: NEGATIVE
Protein, ur: NEGATIVE mg/dL
Specific Gravity, Urine: 1.018 (ref 1.005–1.030)
pH: 7 (ref 5.0–8.0)

## 2022-07-18 LAB — CBC
HCT: 32.5 % — ABNORMAL LOW (ref 36.0–46.0)
Hemoglobin: 10.1 g/dL — ABNORMAL LOW (ref 12.0–15.0)
MCH: 25.2 pg — ABNORMAL LOW (ref 26.0–34.0)
MCHC: 31.1 g/dL (ref 30.0–36.0)
MCV: 81 fL (ref 80.0–100.0)
Platelets: 293 10*3/uL (ref 150–400)
RBC: 4.01 MIL/uL (ref 3.87–5.11)
RDW: 13.8 % (ref 11.5–15.5)
WBC: 9.1 10*3/uL (ref 4.0–10.5)
nRBC: 0 % (ref 0.0–0.2)

## 2022-07-18 LAB — PROTEIN / CREATININE RATIO, URINE
Creatinine, Urine: 162 mg/dL
Protein Creatinine Ratio: 0.09 mg/mg{Cre} (ref 0.00–0.15)
Total Protein, Urine: 14 mg/dL

## 2022-07-18 MED ORDER — ACETAMINOPHEN-CAFFEINE 500-65 MG PO TABS
2.0000 | ORAL_TABLET | Freq: Once | ORAL | Status: AC
  Administered 2022-07-18: 2 via ORAL
  Filled 2022-07-18: qty 2

## 2022-07-18 MED ORDER — CYCLOBENZAPRINE HCL 5 MG PO TABS
10.0000 mg | ORAL_TABLET | Freq: Once | ORAL | Status: AC
  Administered 2022-07-18: 10 mg via ORAL
  Filled 2022-07-18: qty 2

## 2022-07-18 NOTE — Telephone Encounter (Signed)
Preadmission screen  

## 2022-07-18 NOTE — MAU Note (Signed)
..  Diana Burke is a 27 y.o. at [redacted]w[redacted]d here in MAU reporting:  Blood pressure at home that was 170/113. When she was in MAU last time was prescribed blood pressure medicine but patient reports "I don't take medicine and it went down on its own." Headache that began last night. Denies epigastric pain or visual changes.  Denies ctx, vaginal bleeding, or leaking of fluid. +FM  Pain score: 3/10 Vitals:   07/18/22 1954  BP: (!) 143/105  Pulse: (!) 112  Resp: 18  Temp: 98 F (36.7 C)  SpO2: 99%     FHT:140 Lab orders placed from triage:  UA

## 2022-07-18 NOTE — MAU Provider Note (Signed)
History     [id number]  Arrival date and time: 07/18/22 1944    Chief Complaint  Patient presents with   Hypertension   Headache     HPI Diana Burke is a 27 y.o. at [redacted]w[redacted]d with PMHx notable for cHTN, Rh neg, who presents for elevated BP's.   Patient seen in MAU on 06/30/2022, at that time she had elevated BP's in the mild range, asymptomatic, normal labs, started on labetalol and discharged  Patient reports she has been feeling unwell, just overall fatigued for the past day or so When her husband got home from work they checked her blood pressure and got values in the 140's/100's initially and then in the 170's/110's so they came to MAU for an evaluation She has not been taking the labetalol that was previously prescribed as her BP normalized without it on home checks She does not like to take medicines She endorses having a headache right now that she has not taken anything for Denies visual changes, chest pain, SOB, RUQ pain Has an IOL scheduled for [redacted]w[redacted]d   A/Negative/-- (12/04 0000)  OB History     Gravida  2   Para      Term      Preterm      AB  1   Living         SAB  1   IAB      Ectopic      Multiple      Live Births              Past Medical History:  Diagnosis Date   Anemia    Astigmatism of both eyes 10/02/2012   Hypertension     Past Surgical History:  Procedure Laterality Date   DILATION AND EVACUATION N/A 09/02/2021   Procedure: DILATATION AND EVACUATION;  Surgeon: Huel Cote, MD;  Location: [facility] OR;  Service: Gynecology;  Laterality: N/A;    Family History  Problem Relation Age of Onset   Cancer Paternal Grandmother     Social History   Socioeconomic History   Marital status: Single    Spouse name: Not on file   Number of children: Not on file   Years of education: Not on file   Highest education level: Not on file  Occupational History   Not on file  Tobacco Use   Smoking status: Never   Smokeless  tobacco: Never  Vaping Use   Vaping Use: Never used  Substance and Sexual Activity   Alcohol use: Not Currently   Drug use: Not Currently   Sexual activity: Yes  Other Topics Concern   Not on file  Social History Narrative   Not on file   Social Determinants of Health   Financial Resource Strain: Not on file  Food Insecurity: Not on file  Transportation Needs: Not on file  Physical Activity: Not on file  Stress: Not on file  Social Connections: Not on file  Intimate Partner Violence: Not on file    Allergies  Allergen Reactions   Carrot [Daucus Carota] Swelling   Peanut-Containing Drug Products Swelling    No current facility-administered medications on file prior to encounter.   Current Outpatient Medications on File Prior to Encounter  Medication Sig Dispense Refill   aspirin EC 81 MG tablet Take 81 mg by mouth daily. Swallow whole.     Prenatal Vit-Fe Fumarate-FA (PRENATAL MULTIVITAMIN) TABS tablet Take 1 tablet by mouth daily at 12 noon.  labetalol (NORMODYNE) 100 MG tablet Take 1 tablet (100 mg total) by mouth 2 (two) times daily. 60 tablet 0     ROS Pertinent positives and negative per HPI, all others reviewed and negative  Physical Exam   BP (!) 131/94   Pulse (!) 114   Temp 98 F (36.7 C) (Oral)   Resp 18   Ht 5\' 8"  (1.727 m)   Wt 115 kg   LMP 06/10/2021 (Exact Date)   SpO2 99%   BMI 38.56 kg/m   Patient Vitals for the past 24 hrs:  BP Temp Temp src Pulse Resp SpO2 Height Weight  07/18/22 2146 (!) 131/94 -- -- (!) 114 -- -- -- --  07/18/22 2145 -- -- -- -- -- 99 % -- --  07/18/22 2135 -- -- -- -- -- 99 % -- --  07/18/22 2131 (!) 135/95 -- -- (!) 104 -- -- -- --  07/18/22 2116 (!) 132/93 -- -- (!) 105 -- -- -- --  07/18/22 2102 (!) 141/99 -- -- (!) 104 -- 99 % -- --  07/18/22 2045 (!) 146/98 -- -- 99 -- 99 % -- --  07/18/22 2040 -- -- -- -- -- 99 % -- --  07/18/22 2030 (!) 142/100 -- -- (!) 104 -- 99 % -- --  07/18/22 2020 -- -- -- -- -- 99  % -- --  07/18/22 2016 (!) 147/102 -- -- (!) 103 -- -- -- --  07/18/22 1954 (!) 143/105 98 F (36.7 C) Oral (!) 112 18 99 % 5\' 8"  (1.727 m) 115 kg    Physical Exam Vitals reviewed.  Constitutional:      General: She is not in acute distress.    Appearance: She is well-developed. She is not diaphoretic.  Eyes:     General: No scleral icterus. Pulmonary:     Effort: Pulmonary effort is normal. No respiratory distress.  Abdominal:     General: There is no distension.     Palpations: Abdomen is soft.     Tenderness: There is no abdominal tenderness. There is no guarding or rebound.  Skin:    General: Skin is warm and dry.  Neurological:     Mental Status: She is alert.     Coordination: Coordination normal.      Cervical Exam    Bedside Ultrasound Not performed.  My interpretation: n/a  FHT Baseline: 130 bpm Variability: Good {> 6 bpm) Accelerations: Reactive Decelerations: Absent Uterine activity: rare Cat: I  Labs Results for orders placed or performed during the hospital encounter of 07/18/22 (from the past 24 hour(s))  Urinalysis, Routine w reflex microscopic -Urine, Clean Catch     Status: None   Collection Time: 07/18/22  8:18 PM  Result Value Ref Range   Color, Urine YELLOW YELLOW   APPearance CLEAR CLEAR   Specific Gravity, Urine 1.018 1.005 - 1.030   pH 7.0 5.0 - 8.0   Glucose, UA NEGATIVE NEGATIVE mg/dL   Hgb urine dipstick NEGATIVE NEGATIVE   Bilirubin Urine NEGATIVE NEGATIVE   Ketones, ur NEGATIVE NEGATIVE mg/dL   Protein, ur NEGATIVE NEGATIVE mg/dL   Nitrite NEGATIVE NEGATIVE   Leukocytes,Ua NEGATIVE NEGATIVE  Protein / creatinine ratio, urine     Status: None   Collection Time: 07/18/22  8:18 PM  Result Value Ref Range   Creatinine, Urine 162 mg/dL   Total Protein, Urine 14 mg/dL   Protein Creatinine Ratio 0.09 0.00 - 0.15 mg/mg[Cre]  CBC     Status: Abnormal  Collection Time: 07/18/22  8:28 PM  Result Value Ref Range   WBC 9.1 4.0 - 10.5  K/uL   RBC 4.01 3.87 - 5.11 MIL/uL   Hemoglobin 10.1 (L) 12.0 - 15.0 g/dL   HCT 32.9 (L) 51.8 - 84.1 %   MCV 81.0 80.0 - 100.0 fL   MCH 25.2 (L) 26.0 - 34.0 pg   MCHC 31.1 30.0 - 36.0 g/dL   RDW 66.0 63.0 - 16.0 %   Platelets 293 150 - 400 K/uL   nRBC 0.0 0.0 - 0.2 %  Comprehensive metabolic panel     Status: Abnormal   Collection Time: 07/18/22  8:28 PM  Result Value Ref Range   Sodium 133 (L) 135 - 145 mmol/L   Potassium 3.8 3.5 - 5.1 mmol/L   Chloride 104 98 - 111 mmol/L   CO2 21 (L) 22 - 32 mmol/L   Glucose, Bld 81 70 - 99 mg/dL   BUN <5 (L) 6 - 20 mg/dL   Creatinine, Ser 1.09 0.44 - 1.00 mg/dL   Calcium 8.8 (L) 8.9 - 10.3 mg/dL   Total Protein 6.6 6.5 - 8.1 g/dL   Albumin 2.8 (L) 3.5 - 5.0 g/dL   AST 19 15 - 41 U/L   ALT 14 0 - 44 U/L   Alkaline Phosphatase 164 (H) 38 - 126 U/L   Total Bilirubin 0.2 (L) 0.3 - 1.2 mg/dL   GFR, Estimated >32 >35 mL/min   Anion gap 8 5 - 15    Imaging No results found.  MAU Course  Procedures Lab Orders         Urinalysis, Routine w reflex microscopic -Urine, Clean Catch         CBC         Comprehensive metabolic panel         Protein / creatinine ratio, urine    Meds ordered this encounter  Medications   acetaminophen-caffeine (EXCEDRIN TENSION HEADACHE) 500-65 MG per tablet 2 tablet   cyclobenzaprine (FLEXERIL) tablet 10 mg   Imaging Orders  No imaging studies ordered today    MDM Moderate (Level 3-4)  Assessment and Plan  #Chronic hypertension #headache in pregnancy #[redacted] weeks gestation of pregnancy Patient presenting with headache. Treated with excedrin tension and flexeril with resolution of headache. CBC, CMP, UPCR are unremarkable. BP's trended toward lower end of mild range during MAU stay. Discussed with patient she should resume her labetalol. Has IOL scheduled in four days, but discussed PreE return precautions.   #FWB FHT Cat I NST: Reactive   Dispo: discharged to home in stable condition    Venora Maples, MD/MPH 07/18/22 9:49 PM  Allergies as of 07/18/2022       Reactions   Carrot [daucus Carota] Swelling   Peanut-containing Drug Products Swelling        Medication List     TAKE these medications    aspirin EC 81 MG tablet Take 81 mg by mouth daily. Swallow whole.   labetalol 100 MG tablet Commonly known as: NORMODYNE Take 1 tablet (100 mg total) by mouth 2 (two) times daily.   prenatal multivitamin Tabs tablet Take 1 tablet by mouth daily at 12 noon.

## 2022-07-20 ENCOUNTER — Telehealth (HOSPITAL_COMMUNITY): Payer: Self-pay | Admitting: *Deleted

## 2022-07-20 ENCOUNTER — Other Ambulatory Visit (HOSPITAL_COMMUNITY): Payer: Self-pay | Admitting: *Deleted

## 2022-07-20 ENCOUNTER — Other Ambulatory Visit: Payer: Self-pay | Admitting: Obstetrics and Gynecology

## 2022-07-20 DIAGNOSIS — O10913 Unspecified pre-existing hypertension complicating pregnancy, third trimester: Secondary | ICD-10-CM

## 2022-07-20 NOTE — Telephone Encounter (Signed)
Preadmission screen  

## 2022-07-23 ENCOUNTER — Other Ambulatory Visit: Payer: Self-pay

## 2022-07-23 ENCOUNTER — Inpatient Hospital Stay (HOSPITAL_COMMUNITY)
Admission: RE | Admit: 2022-07-23 | Discharge: 2022-07-25 | DRG: 807 | Disposition: A | Discharge status: Home / Self Care | Payer: BC Managed Care – PPO | Attending: Obstetrics and Gynecology | Admitting: Obstetrics and Gynecology

## 2022-07-23 ENCOUNTER — Inpatient Hospital Stay (HOSPITAL_COMMUNITY): Payer: BC Managed Care – PPO | Admitting: Anesthesiology

## 2022-07-23 ENCOUNTER — Inpatient Hospital Stay (HOSPITAL_COMMUNITY): Payer: BC Managed Care – PPO

## 2022-07-23 ENCOUNTER — Encounter (HOSPITAL_COMMUNITY): Payer: Self-pay | Admitting: Obstetrics and Gynecology

## 2022-07-23 DIAGNOSIS — Z3A38 38 weeks gestation of pregnancy: Secondary | ICD-10-CM | POA: Diagnosis not present

## 2022-07-23 DIAGNOSIS — O10919 Unspecified pre-existing hypertension complicating pregnancy, unspecified trimester: Principal | ICD-10-CM

## 2022-07-23 DIAGNOSIS — Z6791 Unspecified blood type, Rh negative: Secondary | ICD-10-CM

## 2022-07-23 DIAGNOSIS — O10913 Unspecified pre-existing hypertension complicating pregnancy, third trimester: Principal | ICD-10-CM

## 2022-07-23 DIAGNOSIS — O99214 Obesity complicating childbirth: Secondary | ICD-10-CM | POA: Diagnosis present

## 2022-07-23 DIAGNOSIS — Z23 Encounter for immunization: Secondary | ICD-10-CM

## 2022-07-23 DIAGNOSIS — O26893 Other specified pregnancy related conditions, third trimester: Secondary | ICD-10-CM | POA: Diagnosis present

## 2022-07-23 DIAGNOSIS — O1092 Unspecified pre-existing hypertension complicating childbirth: Secondary | ICD-10-CM | POA: Diagnosis present

## 2022-07-23 LAB — CBC WITH DIFFERENTIAL/PLATELET
Abs Immature Granulocytes: 0.1 10*3/uL — ABNORMAL HIGH (ref 0.00–0.07)
Basophils Absolute: 0 10*3/uL (ref 0.0–0.1)
Basophils Relative: 0 %
Eosinophils Absolute: 0 10*3/uL (ref 0.0–0.5)
Eosinophils Relative: 0 %
HCT: 31.1 % — ABNORMAL LOW (ref 36.0–46.0)
Hemoglobin: 9.7 g/dL — ABNORMAL LOW (ref 12.0–15.0)
Immature Granulocytes: 1 %
Lymphocytes Relative: 5 %
Lymphs Abs: 0.9 10*3/uL (ref 0.7–4.0)
MCH: 24.9 pg — ABNORMAL LOW (ref 26.0–34.0)
MCHC: 31.2 g/dL (ref 30.0–36.0)
MCV: 79.7 fL — ABNORMAL LOW (ref 80.0–100.0)
Monocytes Absolute: 1.1 10*3/uL — ABNORMAL HIGH (ref 0.1–1.0)
Monocytes Relative: 6 %
Neutro Abs: 16.5 10*3/uL — ABNORMAL HIGH (ref 1.7–7.7)
Neutrophils Relative %: 88 %
Platelets: 258 10*3/uL (ref 150–400)
RBC: 3.9 MIL/uL (ref 3.87–5.11)
RDW: 14 % (ref 11.5–15.5)
WBC: 18.7 10*3/uL — ABNORMAL HIGH (ref 4.0–10.5)
nRBC: 0 % (ref 0.0–0.2)

## 2022-07-23 LAB — COMPREHENSIVE METABOLIC PANEL
ALT: 15 U/L (ref 0–44)
AST: 21 U/L (ref 15–41)
Albumin: 2.8 g/dL — ABNORMAL LOW (ref 3.5–5.0)
Alkaline Phosphatase: 167 U/L — ABNORMAL HIGH (ref 38–126)
Anion gap: 10 (ref 5–15)
BUN: 7 mg/dL (ref 6–20)
CO2: 21 mmol/L — ABNORMAL LOW (ref 22–32)
Calcium: 9.2 mg/dL (ref 8.9–10.3)
Chloride: 102 mmol/L (ref 98–111)
Creatinine, Ser: 0.74 mg/dL (ref 0.44–1.00)
GFR, Estimated: >60 mL/min (ref >60)
Glucose, Bld: 94 mg/dL (ref 70–99)
Potassium: 3.5 mmol/L (ref 3.5–5.1)
Sodium: 133 mmol/L — ABNORMAL LOW (ref 135–145)
Total Bilirubin: 0.5 mg/dL (ref 0.3–1.2)
Total Protein: 6.6 g/dL (ref 6.5–8.1)

## 2022-07-23 LAB — CBC
HCT: 32.8 % — ABNORMAL LOW (ref 36.0–46.0)
Hemoglobin: 10.3 g/dL — ABNORMAL LOW (ref 12.0–15.0)
MCH: 25.2 pg — ABNORMAL LOW (ref 26.0–34.0)
MCHC: 31.4 g/dL (ref 30.0–36.0)
MCV: 80.4 fL (ref 80.0–100.0)
Platelets: 299 10*3/uL (ref 150–400)
RBC: 4.08 MIL/uL (ref 3.87–5.11)
RDW: 14 % (ref 11.5–15.5)
WBC: 9.5 10*3/uL (ref 4.0–10.5)
nRBC: 0 % (ref 0.0–0.2)

## 2022-07-23 LAB — PROTEIN / CREATININE RATIO, URINE
Creatinine, Urine: 31 mg/dL
Total Protein, Urine: <6 mg/dL

## 2022-07-23 LAB — TYPE AND SCREEN
ABO/RH(D): A NEG
Antibody Screen: POSITIVE

## 2022-07-23 LAB — RPR: RPR Ser Ql: NONREACTIVE

## 2022-07-23 MED ORDER — PHENYLEPHRINE 80 MCG/ML (10ML) SYRINGE FOR IV PUSH (FOR BLOOD PRESSURE SUPPORT): 80.0000 ug | PREFILLED_SYRINGE | INTRAVENOUS | Status: DC | PRN

## 2022-07-23 MED ORDER — OXYTOCIN BOLUS FROM INFUSION
333.0000 mL | Freq: Once | INTRAVENOUS | Status: AC
  Administered 2022-07-23: 333 mL via INTRAVENOUS

## 2022-07-23 MED ORDER — AMMONIA AROMATIC IN INHA
RESPIRATORY_TRACT | Status: AC
  Filled 2022-07-23: qty 10

## 2022-07-23 MED ORDER — OXYTOCIN-SODIUM CHLORIDE 30-0.9 UT/500ML-% IV SOLN
1.0000 m[IU]/min | INTRAVENOUS | Status: DC
  Administered 2022-07-23: 2 m[IU]/min via INTRAVENOUS

## 2022-07-23 MED ORDER — BUPIVACAINE HCL (PF) 0.25 % IJ SOLN
INTRAMUSCULAR | Status: DC | PRN
  Administered 2022-07-23 (×2): 4 mL via EPIDURAL

## 2022-07-23 MED ORDER — SOD CITRATE-CITRIC ACID 500-334 MG/5ML PO SOLN: 30.0000 mL | ORAL | Status: DC | PRN

## 2022-07-23 MED ORDER — TERBUTALINE SULFATE 1 MG/ML IJ SOLN: 0.2500 mg | Freq: Once | INTRAMUSCULAR | Status: DC | PRN

## 2022-07-23 MED ORDER — LACTATED RINGERS IV SOLN: INTRAVENOUS | Status: DC

## 2022-07-23 MED ORDER — LIDOCAINE HCL (PF) 1 % IJ SOLN
30.0000 mL | INTRAMUSCULAR | Status: AC | PRN
  Administered 2022-07-23: 30 mL via SUBCUTANEOUS
  Filled 2022-07-23: qty 30

## 2022-07-23 MED ORDER — EPHEDRINE 5 MG/ML INJ: 10.0000 mg | INTRAVENOUS | Status: DC | PRN

## 2022-07-23 MED ORDER — OXYTOCIN-SODIUM CHLORIDE 30-0.9 UT/500ML-% IV SOLN
2.5000 [IU]/h | INTRAVENOUS | Status: DC
  Administered 2022-07-23: 2.5 [IU]/h via INTRAVENOUS
  Filled 2022-07-23: qty 500

## 2022-07-23 MED ORDER — LACTATED RINGERS IV SOLN
500.0000 mL | Freq: Once | INTRAVENOUS | Status: AC
  Administered 2022-07-23 (×2): 500 mL via INTRAVENOUS

## 2022-07-23 MED ORDER — ONDANSETRON HCL 4 MG/2ML IJ SOLN
4.0000 mg | Freq: Four times a day (QID) | INTRAMUSCULAR | Status: DC | PRN
  Administered 2022-07-23: 4 mg via INTRAVENOUS
  Filled 2022-07-23: qty 2

## 2022-07-23 MED ORDER — FENTANYL CITRATE (PF) 100 MCG/2ML IJ SOLN
50.0000 ug | INTRAMUSCULAR | Status: DC | PRN
  Administered 2022-07-23 (×2): 100 ug via INTRAVENOUS
  Filled 2022-07-23 (×2): qty 2

## 2022-07-23 MED ORDER — LABETALOL HCL 100 MG PO TABS
100.0000 mg | ORAL_TABLET | Freq: Two times a day (BID) | ORAL | Status: DC
  Administered 2022-07-24: 100 mg via ORAL
  Filled 2022-07-23: qty 1

## 2022-07-23 MED ORDER — DIPHENHYDRAMINE HCL 50 MG/ML IJ SOLN: 12.5000 mg | INTRAMUSCULAR | Status: DC | PRN

## 2022-07-23 MED ORDER — MISOPROSTOL 25 MCG QUARTER TABLET
25.0000 ug | ORAL_TABLET | ORAL | Status: DC | PRN
  Administered 2022-07-23 (×2): 25 ug via VAGINAL
  Filled 2022-07-23 (×2): qty 1

## 2022-07-23 MED ORDER — ACETAMINOPHEN 325 MG PO TABS: 650.0000 mg | ORAL_TABLET | ORAL | Status: DC | PRN

## 2022-07-23 MED ORDER — OXYCODONE-ACETAMINOPHEN 5-325 MG PO TABS: 1.0000 | ORAL_TABLET | ORAL | Status: DC | PRN

## 2022-07-23 MED ORDER — FENTANYL-BUPIVACAINE-NACL 0.5-0.125-0.9 MG/250ML-% EP SOLN
12.0000 mL/h | EPIDURAL | Status: DC | PRN
  Administered 2022-07-23: 12 mL/h via EPIDURAL
  Filled 2022-07-23: qty 250

## 2022-07-23 MED ORDER — LIDOCAINE-EPINEPHRINE (PF) 2 %-1:200000 IJ SOLN
INTRAMUSCULAR | Status: DC | PRN
  Administered 2022-07-23: 3 mL via EPIDURAL

## 2022-07-23 MED ORDER — OXYCODONE-ACETAMINOPHEN 5-325 MG PO TABS: 2.0000 | ORAL_TABLET | ORAL | Status: DC | PRN

## 2022-07-23 MED ORDER — LACTATED RINGERS IV SOLN: 500.0000 mL | INTRAVENOUS | Status: DC | PRN

## 2022-07-23 NOTE — Anesthesia Preprocedure Evaluation (Signed)
Anesthesia Evaluation  Patient identified by MRN, date of birth, ID band Patient awake    Reviewed: Allergy & Precautions, Patient's Chart, lab work & pertinent test results  History of Anesthesia Complications Negative for: history of anesthetic complications  Airway Mallampati: II  TM Distance: >3 FB Neck ROM: Full    Dental no notable dental hx.    Pulmonary neg pulmonary ROS   breath sounds clear to auscultation       Cardiovascular hypertension, negative cardio ROS  Rhythm:Regular     Neuro/Psych negative neurological ROS  negative psych ROS   GI/Hepatic negative GI ROS, Neg liver ROS,,,  Endo/Other  negative endocrine ROS    Renal/GU negative Renal ROS     Musculoskeletal   Abdominal   Peds  Hematology  (+) Blood dyscrasia, anemia Lab Results      Component                Value               Date                      WBC                      9.5                 07/23/2022                HGB                      10.3 (L)            07/23/2022                HCT                      32.8 (L)            07/23/2022                MCV                      80.4                07/23/2022                PLT                      299                 07/23/2022              Anesthesia Other Findings   Reproductive/Obstetrics (+) Pregnancy                             Anesthesia Physical Anesthesia Plan  ASA: 2  Anesthesia Plan: Epidural   Post-op Pain Management:    Induction:   PONV Risk Score and Plan: 2  Airway Management Planned: Natural Airway  Additional Equipment: None  Intra-op Plan:   Post-operative Plan:   Informed Consent: I have reviewed the patients History and Physical, chart, labs and discussed the procedure including the risks, benefits and alternatives for the proposed anesthesia with the patient or authorized representative who has indicated his/her  understanding and acceptance.       Plan Discussed with:   Anesthesia Plan Comments:  Anesthesia Quick Evaluation  

## 2022-07-23 NOTE — Plan of Care (Signed)
  Problem: Education: Goal: Knowledge of Childbirth will improve Outcome: Progressing Goal: Ability to make informed decisions regarding treatment and plan of care will improve Outcome: Progressing Goal: Ability to state and carry out methods to decrease the pain will improve Outcome: Progressing   

## 2022-07-23 NOTE — Anesthesia Procedure Notes (Signed)
Epidural Patient location during procedure: OB Start time: 07/23/2022 12:30 PM End time: 07/23/2022 12:50 PM  Staffing Anesthesiologist: Val Eagle, MD Performed: anesthesiologist   Preanesthetic Checklist Completed: patient identified, IV checked, risks and benefits discussed, monitors and equipment checked, pre-op evaluation and timeout performed  Epidural Patient position: sitting Prep: DuraPrep Patient monitoring: heart rate, continuous pulse ox and blood pressure Approach: midline Location: L4-L5 Injection technique: LOR saline  Needle:  Needle type: Tuohy  Needle gauge: 17 G Needle length: 9 cm Needle insertion depth: 8 cm Catheter type: closed end flexible Catheter size: 19 Gauge Catheter at skin depth: 15 cm Test dose: negative and 2% lidocaine with Epi 1:200 K  Assessment Events: blood not aspirated, no cerebrospinal fluid, injection not painful, no injection resistance, no paresthesia and negative IV test  Additional Notes Reason for block:procedure for pain

## 2022-07-23 NOTE — Progress Notes (Signed)
OB Progress Note  S: comfortable with epidural, foley balloon out   O: Today's Vitals   07/23/22 1257 07/23/22 1302 07/23/22 1332 07/23/22 1402  BP: (!) 139/90 (!) 135/93 (!) 139/92 (!) 139/92  Pulse: 86 93 88 92  Resp:      Temp:      TempSrc:      Weight:      Height:      PainSc:  0-No pain     Body mass index is 38.62 kg/m.  SVE 5/80/-1, AROM clear  FHR: 135bpm, mod variability, + accels, no decels Toco: ctx q 2 mins   A/P: 26Y G2P0010 @ [redacted]w[redacted]d, IOL CHTN 1. Fetal wellbeing: cat I tracing 2. IOL: s/p cytotec x 2, s/p foley balloon, s/p AROM. Continue pitocin.  3. CHTN: preeclampsia labs normal, has not been taking labetalol 100mg  BID that was prescribed. Monitor BP.  4. Pain control: epidural    Diana Burke 07/23/2022, 2:34 PM

## 2022-07-23 NOTE — H&P (Signed)
  Diana Burke is a 27 y.o. female G2P0010 [redacted]w[redacted]d presenting for schedule induction for chronic hypertension. She reports no LOF, VB, Contractions. Normal FM.   Pregnancy c/b: Chronic hypertension: initially diagnosed at age 87 and was treated with antihypertensive medication. Had not been on antihypertensive since age 14. Noted to have mild range BP elevations at 34.4 and 35.2, was started on labetalol 100mg  BID but she admits she hasn't been taking it. Was seen in [facility] on 6/12 and 5/25 for mild BP elevation with normal preeclampsia workup.  Obesity: initial BMI 38, most recent EFW on 6/6: EFW 57.9% (6lb12oz) Rh neg: received Rhogam at 28 weeks  OB History     Gravida  2   Para      Term      Preterm      AB  1   Living         SAB  1   IAB      Ectopic      Multiple      Live Births             Past Medical History:  Diagnosis Date   Anemia    Astigmatism of both eyes 10/02/2012   Hypertension    Past Surgical History:  Procedure Laterality Date   DILATION AND EVACUATION N/A 09/02/2021   Procedure: DILATATION AND EVACUATION;  Surgeon: Huel Cote, MD;  Location: [facility] OR;  Service: Gynecology;  Laterality: N/A;   Family History: family history includes Cancer in her paternal grandmother. Social History:  reports that she has never smoked. She has never used smokeless tobacco. She reports that she does not currently use alcohol. She reports that she does not currently use drugs.     Maternal Diabetes: No Genetic Screening: Normal Maternal Ultrasounds/Referrals: Normal Fetal Ultrasounds or other Referrals:  None Maternal Substance Abuse:  No Significant Maternal Medications:  None Significant Maternal Lab Results:  Group B Strep negative Other Comments:  None  Review of Systems Per HPI Exam Physical Exam  Dilation: 1 Effacement (%): Thick Station: Ballotable Exam by:: Bastian Andreoli Blood pressure (!) 144/104, pulse 88, temperature 98 F (36.7 C),  temperature source Oral, resp. rate 17, height 5\' 8"  (1.727 m), weight 115.2 kg, last menstrual period 06/10/2021, unknown if currently breastfeeding. Gen: NAD, resting comfortably CVS: normal pulses Lungs: Nonlabored respirations Abd: Gravid abdomen Ext: no calf edema or tenderness Fetal testing: 130bpm, mod variability, + accels, no decels Toco: ctx q 1-2 mins  Prenatal labs: ABO, Rh:  --/--/A NEG (06/17 0030) Antibody: POS (06/17 0030) Rubella: Immune (12/04 0000) RPR: NON REACTIVE (06/17 0032)  HBsAg: Negative (12/04 0000)  HIV: Non-reactive (12/04 0000)  GBS: Negative/-- (06/06 0000)   Assessment/Plan: 26Y G2P0010 @ [redacted]w[redacted]d, IOL CHTN 1. Fetal wellbeing: cat I tracing 2. IOL: received 2 doses cytotec PV overnight. I placed cervical foley balloon with 60mL saline at 0900. Planned to start pitocin but has started contracting every minute 3. CHTN: preeclampsia labs normal, has not been taking labetalol 100mg  BID that was prescribed. Monitor BP.  4. Pain control: epidural upon patient request  Charlett Nose 07/23/2022, 11:45 AM

## 2022-07-24 LAB — CBC
HCT: 29 % — ABNORMAL LOW (ref 36.0–46.0)
Hemoglobin: 9.1 g/dL — ABNORMAL LOW (ref 12.0–15.0)
MCH: 24.8 pg — ABNORMAL LOW (ref 26.0–34.0)
MCHC: 31.4 g/dL (ref 30.0–36.0)
MCV: 79 fL — ABNORMAL LOW (ref 80.0–100.0)
Platelets: 259 10*3/uL (ref 150–400)
RBC: 3.67 MIL/uL — ABNORMAL LOW (ref 3.87–5.11)
RDW: 14 % (ref 11.5–15.5)
WBC: 17.5 10*3/uL — ABNORMAL HIGH (ref 4.0–10.5)
nRBC: 0 % (ref 0.0–0.2)

## 2022-07-24 LAB — RH IG WORKUP (INCLUDES ABO/RH)

## 2022-07-24 MED ORDER — LABETALOL HCL 200 MG PO TABS
200.0000 mg | ORAL_TABLET | Freq: Two times a day (BID) | ORAL | Status: DC
  Administered 2022-07-24 – 2022-07-25 (×2): 200 mg via ORAL
  Filled 2022-07-24 (×2): qty 1

## 2022-07-24 MED ORDER — TETANUS-DIPHTH-ACELL PERTUSSIS 5-2.5-18.5 LF-MCG/0.5 IM SUSY: 0.5000 mL | PREFILLED_SYRINGE | Freq: Once | INTRAMUSCULAR | Status: DC

## 2022-07-24 MED ORDER — DIPHENHYDRAMINE HCL 25 MG PO CAPS: 25.0000 mg | ORAL_CAPSULE | Freq: Four times a day (QID) | ORAL | Status: DC | PRN

## 2022-07-24 MED ORDER — BISACODYL 10 MG RE SUPP: 10.0000 mg | Freq: Every day | RECTAL | Status: DC | PRN

## 2022-07-24 MED ORDER — ONDANSETRON HCL 4 MG/2ML IJ SOLN: 4.0000 mg | INTRAMUSCULAR | Status: DC | PRN

## 2022-07-24 MED ORDER — PRENATAL MULTIVITAMIN CH
1.0000 | ORAL_TABLET | Freq: Every day | ORAL | Status: DC
  Administered 2022-07-24: 1 via ORAL
  Filled 2022-07-24: qty 1

## 2022-07-24 MED ORDER — WITCH HAZEL-GLYCERIN EX PADS: 1.0000 | MEDICATED_PAD | CUTANEOUS | Status: DC | PRN

## 2022-07-24 MED ORDER — OXYCODONE HCL 5 MG PO TABS: 10.0000 mg | ORAL_TABLET | ORAL | Status: DC | PRN

## 2022-07-24 MED ORDER — DOCUSATE SODIUM 100 MG PO CAPS
100.0000 mg | ORAL_CAPSULE | Freq: Two times a day (BID) | ORAL | Status: DC
  Administered 2022-07-24 – 2022-07-25 (×3): 100 mg via ORAL
  Filled 2022-07-24 (×5): qty 1

## 2022-07-24 MED ORDER — FLEET ENEMA 7-19 GM/118ML RE ENEM: 1.0000 | ENEMA | Freq: Every day | RECTAL | Status: DC | PRN

## 2022-07-24 MED ORDER — DIBUCAINE (PERIANAL) 1 % EX OINT: 1.0000 | TOPICAL_OINTMENT | CUTANEOUS | Status: DC | PRN

## 2022-07-24 MED ORDER — IBUPROFEN 600 MG PO TABS
600.0000 mg | ORAL_TABLET | Freq: Four times a day (QID) | ORAL | Status: DC
  Administered 2022-07-24 – 2022-07-25 (×4): 600 mg via ORAL
  Filled 2022-07-24 (×5): qty 1

## 2022-07-24 MED ORDER — SIMETHICONE 80 MG PO CHEW: 80.0000 mg | CHEWABLE_TABLET | ORAL | Status: DC | PRN

## 2022-07-24 MED ORDER — COCONUT OIL OIL: 1.0000 | TOPICAL_OIL | Status: DC | PRN

## 2022-07-24 MED ORDER — OXYCODONE HCL 5 MG PO TABS: 5.0000 mg | ORAL_TABLET | ORAL | Status: DC | PRN

## 2022-07-24 MED ORDER — ONDANSETRON HCL 4 MG PO TABS: 4.0000 mg | ORAL_TABLET | ORAL | Status: DC | PRN

## 2022-07-24 MED ORDER — ACETAMINOPHEN 325 MG PO TABS: 650.0000 mg | ORAL_TABLET | ORAL | Status: DC | PRN

## 2022-07-24 MED ORDER — BENZOCAINE-MENTHOL 20-0.5 % EX AERO
1.0000 | INHALATION_SPRAY | CUTANEOUS | Status: DC | PRN
  Administered 2022-07-24: 1 via TOPICAL
  Filled 2022-07-24: qty 56

## 2022-07-24 NOTE — Lactation Note (Signed)
This note was copied from a baby's chart. Lactation Consultation Note  Patient Name: Diana Burke Today's Date: 07/24/2022 Age:26 hours  LC attempted to see Birth Parent at this time, however Birth Parent and infant asleep. LC services will follow up with family in the morning.   Maternal Data    Feeding    LATCH Score Latch: Repeated attempts needed to sustain latch, nipple held in mouth throughout feeding, stimulation needed to elicit sucking reflex.  Audible Swallowing: A few with stimulation  Type of Nipple: Everted at rest and after stimulation  Comfort (Breast/Nipple): Soft / non-tender  Hold (Positioning): Assistance needed to correctly position infant at breast and maintain latch.  LATCH Score: 7   Lactation Tools Discussed/Used    Interventions    Discharge    Consult Status      Frederico Hamman 07/24/2022, 2:05 AM

## 2022-07-24 NOTE — Lactation Note (Signed)
This note was copied from a baby's chart. Lactation Consultation Note  Patient Name: Diana Burke Today's Date: 07/24/2022 Age:26 hours Reason for consult: Initial assessment;1st time breastfeeding;Early term 37-38.6wks  P1, Baby finishing bath.  Reviewed hand express with strong flow of colostrum.  Assisted with latching in football hold.  Baby sleepy after bath and breastfed briefly for 5 min.  Baby will be skin to skin with father.  Discussed basics and encouraged feeding 8-12 times per day.  Mom made aware of O/P services, breastfeeding support group and our phone # for post-discharge questions.     Maternal Data Has patient been taught Hand Expression?: Yes Does the patient have breastfeeding experience prior to this delivery?: No  Feeding Mother's Current Feeding Choice: Breast Milk  LATCH Score Latch: Repeated attempts needed to sustain latch, nipple held in mouth throughout feeding, stimulation needed to elicit sucking reflex.  Audible Swallowing: A few with stimulation  Type of Nipple: Everted at rest and after stimulation  Comfort (Breast/Nipple): Soft / non-tender  Hold (Positioning): Assistance needed to correctly position infant at breast and maintain latch.  LATCH Score: 7   Lactation Tools Discussed/Used    Interventions Interventions: Breast feeding basics reviewed;Assisted with latch;Skin to skin;Hand express;Support pillows;Education;LC Services brochure  Discharge Pump: Personal;Hands Free;DEBP  Consult Status Consult Status: Follow-up Date: 07/25/22 Follow-up type: In-patient    Dahlia Byes [facility] 07/24/2022, 10:09 AM

## 2022-07-24 NOTE — Progress Notes (Signed)
PPD #1 No problems Afeb, VSS, BP 130-140/80-90 Fundus firm, NT at U-1 Continue routine postpartum care.  CHTN-on Labetalol 200 mg bid, will monitor today and see if needs higher dose

## 2022-07-24 NOTE — Anesthesia Postprocedure Evaluation (Signed)
Anesthesia Post Note  Patient: Diana Burke  Procedure(s) Performed: AN AD HOC LABOR EPIDURAL     Patient location during evaluation: Mother Baby Anesthesia Type: Epidural Level of consciousness: awake and alert and oriented Pain management: satisfactory to patient Vital Signs Assessment: post-procedure vital signs reviewed and stable Respiratory status: respiratory function stable Cardiovascular status: stable Postop Assessment: no headache, no backache, epidural receding, patient able to bend at knees, no signs of nausea or vomiting, adequate PO intake and able to ambulate Anesthetic complications: no   No notable events documented.  Last Vitals:  Vitals:   07/24/22 0044 07/24/22 0451  BP: 132/88 (!) 148/97  Pulse: 95 100  Resp: 19 16  Temp: 37.1 C 36.9 C  SpO2:  100%    Last Pain:  Vitals:   07/24/22 0456  TempSrc:   PainSc: 0-No pain   Pain Goal:                   Yoshito Gaza

## 2022-07-25 LAB — RH IG WORKUP (INCLUDES ABO/RH)

## 2022-07-25 MED ORDER — IBUPROFEN 600 MG PO TABS: 600.0000 mg | ORAL_TABLET | Freq: Four times a day (QID) | ORAL | 0 refills | Status: DC | PRN

## 2022-07-25 MED ORDER — RHO D IMMUNE GLOBULIN 1500 UNIT/2ML IJ SOSY
300.0000 ug | PREFILLED_SYRINGE | Freq: Once | INTRAMUSCULAR | Status: AC
  Administered 2022-07-25: 300 ug via INTRAVENOUS
  Filled 2022-07-25: qty 2

## 2022-07-25 MED ORDER — LABETALOL HCL 200 MG PO TABS: 100.0000 mg | ORAL_TABLET | Freq: Two times a day (BID) | ORAL | 0 refills | Status: DC

## 2022-07-25 NOTE — Progress Notes (Signed)
   07/25/22 1000  Spiritual Encounters  Type of Visit Initial  Care provided to: Pt and family  Referral source Chaplain assessment  Reason for visit Routine spiritual support  OnCall Visit No  Spiritual Framework  Presenting Themes Significant life change  Community/Connection Family  Patient Stress Factors Major life changes  Family Stress Factors Major life changes  Interventions  Spiritual Care Interventions Made Established relationship of care and support;Compassionate presence;Reflective listening;Normalization of emotions  Intervention Outcomes  Outcomes Awareness around self/spiritual resourses;Awareness of health;Awareness of support  Spiritual Care Plan  Spiritual Care Issues Still Outstanding No further spiritual care needs at this time (see row info)    The chaplain visited the baby and mom. The mom had an epidural but after four hours the epidural's affect passed off and she started to feel every pain. She said it was painful but I can do it again it worth it. The baby's name, Diana Burke, was chosen collaboratively by mom and dad. They decided to honor dad's late mother and mom's cousin by giving their names as middle names to the baby. The dad was very excited to have a baby girl. Diana Burke said that she is happy, excited and exhausted altogether. Chaplain gave spiritual support and mentioned importance of health.    M.Kubra Tamim Skog, MA Chaplain Intern [phone]

## 2022-07-25 NOTE — Lactation Note (Signed)
This note was copied from a baby's chart. Lactation Consultation Note  Patient Name: Diana Burke [id number] Date: 07/25/2022 Age:26 hours Reason for consult: Follow-up assessment;1st time breastfeeding;Early term 37-38.6wks  P1, Baby sleeping skin to skin on mother's chest.  Encouraged longer feedings and on both breasts.  Suggest calling for latch assistance as needed.  Reviewed engorgement care and monitoring voids/stools.  Maternal Data Has patient been taught Hand Expression?: Yes  Feeding Mother's Current Feeding Choice: Breast Milk Interventions Interventions: Education  Discharge Discharge Education: Engorgement and breast care;Warning signs for feeding baby  Consult Status Consult Status: Complete Date: 07/25/22    Dahlia Byes [facility] 07/25/2022, 11:05 AM

## 2022-07-25 NOTE — Discharge Summary (Signed)
Postpartum Discharge Summary       Patient Name: Diana Burke DOB: [birth year] MRN: [id number]  Date of admission: 07/23/2022 Delivery date:07/23/2022  Delivering provider: Derl Barrow E  Date of discharge: 07/25/2022  Admitting diagnosis: Chronic hypertension affecting pregnancy [O10.919] Intrauterine pregnancy: [redacted]w[redacted]d     Secondary diagnosis:  Principal Problem:   Chronic hypertension affecting pregnancy  Additional problems: RH neg    Discharge diagnosis: Term Pregnancy Delivered and CHTN                                              Post partum procedures:rhogam Augmentation: AROM, Pitocin, and Cytotec Complications: None  Hospital course: Induction of Labor With Vaginal Delivery   27 y.o. yo G2P1011 at [redacted]w[redacted]d was admitted to the hospital 07/23/2022 for induction of labor.  Indication for induction:  CHTN .  Patient had an labor course complicated by nothing Membrane Rupture Time/Date: 2:19 PM ,07/23/2022   Delivery Method:Vaginal, Spontaneous  Episiotomy: None  Lacerations:  2nd degree;Perineal  Details of delivery can be found in separate delivery note.  Patient had a postpartum course complicated by nothing. Patient is discharged home 07/25/22.  Newborn Data: Birth date:07/23/2022  Birth time:9:29 PM  Gender:Female  Living status:Living  Apgars:8 ,9  Weight:3487 g   Magnesium Sulfate received: No BMZ received: No Rhophylac:Yes Physical exam  Vitals:   07/24/22 0944 07/24/22 1437 07/24/22 2215 07/25/22 0519  BP: (!) 138/96 120/87 (!) 131/93 (!) 127/99  Pulse: 90 89 83 79  Resp: 16 16 19 18   Temp: 98.6 F (37 C) 98 F (36.7 C) 97.9 F (36.6 C) 97.9 F (36.6 C)  TempSrc: Oral Oral Oral Oral  SpO2: 100%  99% 98%  Weight:      Height:       General: alert, cooperative, and no distress Lochia: appropriate Uterine Fundus: firm DVT Evaluation: No evidence of DVT seen on physical exam. Labs: Lab Results  Component Value Date   WBC 17.5 (H)  07/24/2022   HGB 9.1 (L) 07/24/2022   HCT 29.0 (L) 07/24/2022   MCV 79.0 (L) 07/24/2022   PLT 259 07/24/2022      Latest Ref Rng & Units 07/23/2022   12:32 AM  CMP  Glucose 70 - 99 mg/dL 94   BUN 6 - 20 mg/dL 7   Creatinine 4.09 - 8.11 mg/dL 9.14   Sodium 782 - 956 mmol/L 133   Potassium 3.5 - 5.1 mmol/L 3.5   Chloride 98 - 111 mmol/L 102   CO2 22 - 32 mmol/L 21   Calcium 8.9 - 10.3 mg/dL 9.2   Total Protein 6.5 - 8.1 g/dL 6.6   Total Bilirubin 0.3 - 1.2 mg/dL 0.5   Alkaline Phos 38 - 126 U/L 167   AST 15 - 41 U/L 21   ALT 0 - 44 U/L 15    Edinburgh Score:     No data to display            After visit meds:  Allergies as of 07/25/2022       Reactions   Carrot [daucus Carota] Swelling   Peanut-containing Drug Products Swelling        Medication List     STOP taking these medications    aspirin EC 81 MG tablet       TAKE these medications  ibuprofen 600 MG tablet Commonly known as: ADVIL Take 1 tablet (600 mg total) by mouth every 6 (six) hours as needed.   labetalol 200 MG tablet Commonly known as: NORMODYNE Take 0.5 tablets (100 mg total) by mouth 2 (two) times daily. What changed: medication strength   prenatal multivitamin Tabs tablet Take 1 tablet by mouth daily at 12 noon.               Discharge Care Instructions  (From admission, onward)           Start     Ordered   07/25/22 0000  Discharge wound care:       Comments: For a cesarean delivery: You may wash incision with soap and water.  Do not soak or submerge the incision for 2 weeks. Keep incision dry. You may need to keep a sanitary pad or panty liner between the incision and your clothing for comfort and to keep the incision dry. If you note drainage, increased pain, or increased redness of the incision, then please notify your physician.   07/25/22 1020   07/25/22 0000  If the dressing is still on your incision site when you go home, remove it on the third day after  your surgery date. Remove dressing if it begins to fall off, or if it is dirty or damaged before the third day.       Comments: For a cesarean delivery   07/25/22 1020             Discharge home in stable condition Infant Feeding: Breast Infant Disposition:home with mother Discharge instruction: per After Visit Summary and Postpartum booklet. Activity: Advance as tolerated. Pelvic rest for 6 weeks.  Diet: routine diet Anticipated Birth Control: Unsure Postpartum Appointment:1 week Future Appointments:No future appointments. Follow up Visit:  Follow-up Information     [facility] Follow up in 1 week(s).   Why: For a blood pressure check Contact information: [address]  [address] [place] [address] [phone]                     07/25/2022 Waynard Reeds, MD

## 2022-07-25 NOTE — Plan of Care (Signed)
Complete

## 2022-07-26 LAB — RH IG WORKUP (INCLUDES ABO/RH)
Fetal Screen: NEGATIVE
Gestational Age(Wks): 38
Unit division: 0

## 2022-08-13 ENCOUNTER — Telehealth (HOSPITAL_COMMUNITY): Payer: Self-pay | Admitting: *Deleted

## 2022-08-13 NOTE — Telephone Encounter (Signed)
Attempted hospital discharge follow-up call. Left message for patient to return RN call with any questions or concerns. Deforest Hoyles, RN, 08/13/22, [phone]

## 2022-12-05 LAB — OB RESULTS CONSOLE GC/CHLAMYDIA
Chlamydia: NEGATIVE
Neisseria Gonorrhea: NEGATIVE

## 2022-12-24 LAB — OB RESULTS CONSOLE RUBELLA ANTIBODY, IGM: Rubella: IMMUNE

## 2022-12-24 LAB — OB RESULTS CONSOLE HEPATITIS B SURFACE ANTIGEN: Hepatitis B Surface Ag: NEGATIVE

## 2022-12-24 LAB — HEPATITIS C ANTIBODY: HCV Ab: NEGATIVE

## 2022-12-24 LAB — OB RESULTS CONSOLE HIV ANTIBODY (ROUTINE TESTING): HIV: NONREACTIVE

## 2023-02-06 NOTE — L&D Delivery Note (Signed)
 Delivery Note Diana Burke is a [id number] at [redacted]w[redacted]d who had a spontaneous delivery at 2036 a viable female [place]") was delivered via  LOA.  APGAR: 8, 7 ; weight pending .     Admitted for induction of labor for chronic hypertension requiring increase of antihypertensives. Induced with pitocin , AROM. Progressed normally. Receivedepidural for pain management. Pushed for <1 minutes. Baby was delivered without difficulty. Loose nuchal cord x 1, reduced after delivery of infant shoulders.  Delayed cord clamping for 60 seconds.  Delivery of placenta was spontaneous. Placenta was found to be intact, 3 -vessel cord was noted. The fundus was found to be firm. 1st degree perineal laceration  and left periurethral laceration were repaired in the normal sterile fashion with 3-0 vicryl.  Estimated blood loss 122cc.  Instrument and gauze counts were correct at the end of the procedure.  Baby boy required CPAP to maintain oxygenation, NICU called to evaluate, ultimately decided to take baby to NICU for continued breathing support.   Placenta status: family desires to take placenta home  Anesthesia:  epidural Episiotomy:  none Lacerations:  1st degree perineal and left periurethral Suture Repair: 3.0 vicryl Est. Blood Loss (mL):  122  Mom to postpartum.  Baby to NICU.  Theodoro Fisherman 06/25/2023, 9:43 PM

## 2023-06-01 ENCOUNTER — Encounter (HOSPITAL_COMMUNITY): Payer: Self-pay | Admitting: *Deleted

## 2023-06-01 ENCOUNTER — Inpatient Hospital Stay (HOSPITAL_COMMUNITY)
Admission: AD | Admit: 2023-06-01 | Discharge: 2023-06-02 | Disposition: A | Discharge status: Home / Self Care | Attending: Obstetrics and Gynecology | Admitting: Obstetrics and Gynecology

## 2023-06-01 DIAGNOSIS — Z79899 Other long term (current) drug therapy: Secondary | ICD-10-CM | POA: Diagnosis not present

## 2023-06-01 DIAGNOSIS — O10013 Pre-existing essential hypertension complicating pregnancy, third trimester: Secondary | ICD-10-CM | POA: Insufficient documentation

## 2023-06-01 DIAGNOSIS — O163 Unspecified maternal hypertension, third trimester: Secondary | ICD-10-CM | POA: Diagnosis not present

## 2023-06-01 DIAGNOSIS — R519 Headache, unspecified: Secondary | ICD-10-CM | POA: Diagnosis not present

## 2023-06-01 DIAGNOSIS — O26893 Other specified pregnancy related conditions, third trimester: Secondary | ICD-10-CM | POA: Diagnosis not present

## 2023-06-01 DIAGNOSIS — O99013 Anemia complicating pregnancy, third trimester: Secondary | ICD-10-CM | POA: Insufficient documentation

## 2023-06-01 DIAGNOSIS — O219 Vomiting of pregnancy, unspecified: Secondary | ICD-10-CM | POA: Insufficient documentation

## 2023-06-01 DIAGNOSIS — Z3A33 33 weeks gestation of pregnancy: Secondary | ICD-10-CM | POA: Insufficient documentation

## 2023-06-01 LAB — CBC
HCT: 30 % — ABNORMAL LOW (ref 36.0–46.0)
Hemoglobin: 9.4 g/dL — ABNORMAL LOW (ref 12.0–15.0)
MCH: 25.1 pg — ABNORMAL LOW (ref 26.0–34.0)
MCHC: 31.3 g/dL (ref 30.0–36.0)
MCV: 80 fL (ref 80.0–100.0)
Platelets: 226 10*3/uL (ref 150–400)
RBC: 3.75 MIL/uL — ABNORMAL LOW (ref 3.87–5.11)
RDW: 14 % (ref 11.5–15.5)
WBC: 8.7 10*3/uL (ref 4.0–10.5)
nRBC: 0 % (ref 0.0–0.2)

## 2023-06-01 LAB — URINALYSIS, ROUTINE W REFLEX MICROSCOPIC
Bacteria, UA: NONE SEEN
Bilirubin Urine: NEGATIVE
Glucose, UA: NEGATIVE mg/dL
Hgb urine dipstick: NEGATIVE
Ketones, ur: NEGATIVE mg/dL
Nitrite: NEGATIVE
Protein, ur: NEGATIVE mg/dL
Specific Gravity, Urine: 1.009 (ref 1.005–1.030)
pH: 7 (ref 5.0–8.0)

## 2023-06-01 LAB — COMPREHENSIVE METABOLIC PANEL WITH GFR
ALT: 11 U/L (ref 0–44)
AST: 15 U/L (ref 15–41)
Albumin: 2.7 g/dL — ABNORMAL LOW (ref 3.5–5.0)
Alkaline Phosphatase: 84 U/L (ref 38–126)
Anion gap: 8 (ref 5–15)
BUN: 5 mg/dL — ABNORMAL LOW (ref 6–20)
CO2: 20 mmol/L — ABNORMAL LOW (ref 22–32)
Calcium: 8.7 mg/dL — ABNORMAL LOW (ref 8.9–10.3)
Chloride: 107 mmol/L (ref 98–111)
Creatinine, Ser: 0.61 mg/dL (ref 0.44–1.00)
GFR, Estimated: >60 mL/min (ref >60)
Glucose, Bld: 83 mg/dL (ref 70–99)
Potassium: 3.5 mmol/L (ref 3.5–5.1)
Sodium: 135 mmol/L (ref 135–145)
Total Bilirubin: 0.5 mg/dL (ref 0.0–1.2)
Total Protein: 6.4 g/dL — ABNORMAL LOW (ref 6.5–8.1)

## 2023-06-01 LAB — PROTEIN / CREATININE RATIO, URINE
Creatinine, Urine: 62 mg/dL
Total Protein, Urine: <6 mg/dL

## 2023-06-01 LAB — POCT PREGNANCY, URINE: Preg Test, Ur: POSITIVE — AB

## 2023-06-01 MED ORDER — LABETALOL HCL 100 MG PO TABS: 100.0000 mg | ORAL_TABLET | Freq: Once | ORAL | Status: DC

## 2023-06-01 MED ORDER — ACETAMINOPHEN-CAFFEINE 500-65 MG PO TABS
2.0000 | ORAL_TABLET | Freq: Once | ORAL | Status: AC
  Administered 2023-06-01: 2 via ORAL
  Filled 2023-06-01: qty 2

## 2023-06-01 MED ORDER — DIPHENHYDRAMINE HCL 50 MG/ML IJ SOLN
25.0000 mg | Freq: Once | INTRAMUSCULAR | Status: AC
  Administered 2023-06-01: 25 mg via INTRAVENOUS
  Filled 2023-06-01: qty 1

## 2023-06-01 MED ORDER — METOCLOPRAMIDE HCL 5 MG/ML IJ SOLN
10.0000 mg | Freq: Once | INTRAMUSCULAR | Status: AC
  Administered 2023-06-01: 10 mg via INTRAVENOUS
  Filled 2023-06-01: qty 2

## 2023-06-01 NOTE — MAU Note (Signed)
.  Diana Burke is a 28 y.o. at [redacted]w[redacted]d here in MAU reporting: has had a headache all day( has not taken anything for it).  b/p has been elevated 160/110 went down to 147/93. Reports pain in her RUQ . C/o N/V. Was started on Labetalol  100mg  BID . Took her morning dose but reported she vomited it up. Has not taken evening dose yet. Good fetal movement reported.   LMP:  Onset of complaint: today Pain score: 7 Vitals:   06/01/23 2048  BP: (!) 135/96  Pulse: (!) 122  Resp: 18  Temp: 99.2 F (37.3 C)  SpO2: 98%     FHT: 145  Lab orders placed from triage: u/a PCR

## 2023-06-01 NOTE — MAU Provider Note (Signed)
 Chief Complaint:  Hypertension and Headache   HPI   Event Date/Time   First Provider Initiated Contact with Patient 06/01/23 2135      Diana Burke is a 28 y.o. G3P1011 at [redacted]w[redacted]d who presents to maternity admissions reporting having a HA all day rating it 7/10 on pain scale but states she has not taken any medication for relief. She is also c/o elevated BP's at home ranging from 140's-160's over 90-110 diastolics.   Pregnancy Course: [facility]  Past Medical History:  Diagnosis Date   Anemia    Astigmatism of both eyes 10/02/2012   Hypertension    OB History  Gravida Para Term Preterm AB Living  3 1 1  1 1   SAB IAB Ectopic Multiple Live Births  1   0 1    # Outcome Date GA Lbr Len/2nd Weight Sex Type Anes PTL Lv  3 Current           2 Term 07/23/22 [redacted]w[redacted]d 05:57 / 01:13 3487 g F Vag-Spont EPI  LIV  1 SAB 09/02/21 [redacted]w[redacted]d    SAB      Past Surgical History:  Procedure Laterality Date   DILATION AND EVACUATION N/A 09/02/2021   Procedure: DILATATION AND EVACUATION;  Surgeon: Rogene Claude, MD;  [facility];  Service: Gynecology;  Laterality: N/A;   Family History  Problem Relation Age of Onset   Cancer Paternal Grandmother    Social History   Tobacco Use   Smoking status: Never   Smokeless tobacco: Never  Vaping Use   Vaping status: Never Used  Substance Use Topics   Alcohol use: Not Currently   Drug use: Not Currently   Allergies  Allergen Reactions   Carrot [Daucus Carota] Swelling   Peanut-Containing Drug Products Swelling   No medications prior to admission.    I have reviewed patient's Past Medical Hx, Surgical Hx, Family Hx, Social Hx, medications and allergies.   ROS  Pertinent items noted in HPI and remainder of comprehensive ROS otherwise negative.   PHYSICAL EXAM  Patient Vitals for the past 24 hrs:  BP Temp Pulse Resp SpO2 Height Weight  06/01/23 2330 112/62 -- 93 -- 99 % -- --  06/01/23 2320 -- -- -- -- 99 % -- --  06/01/23  2315 123/85 -- (!) 108 -- 99 % -- --  06/01/23 2310 -- -- -- -- 98 % -- --  06/01/23 2305 -- -- -- -- 98 % -- --  06/01/23 2300 (!) 110/51 -- (!) 101 -- 98 % -- --  06/01/23 2255 -- -- -- -- 97 % -- --  06/01/23 2250 -- -- -- -- 97 % -- --  06/01/23 2245 (!) 112/46 -- 100 -- 98 % -- --  06/01/23 2240 -- -- -- -- 97 % -- --  06/01/23 2235 (!) 126/95 -- (!) 124 -- 99 % -- --  06/01/23 2205 -- -- -- -- 98 % -- --  06/01/23 2200 (!) 125/90 -- (!) 128 -- 98 % -- --  06/01/23 2155 -- -- -- -- 99 % -- --  06/01/23 2150 -- -- -- -- 97 % -- --  06/01/23 2145 (!) 141/85 -- (!) 123 -- 98 % -- --  06/01/23 2140 -- -- -- -- 98 % -- --  06/01/23 2135 -- -- -- -- 98 % -- --  06/01/23 2130 (!) 136/95 -- (!) 123 -- 98 % -- --  06/01/23 2125 -- -- -- -- 98 % -- --  06/01/23 2120 -- -- -- -- 99 % -- --  06/01/23 2115 (!) 139/90 -- (!) 118 -- 98 % -- --  06/01/23 2110 -- -- -- -- 98 % -- --  06/01/23 2108 (!) 141/94 -- (!) 116 -- -- -- --  06/01/23 2048 (!) 135/96 99.2 F (37.3 C) (!) 122 18 98 % 5\' 8"  (1.727 m) 113.9 kg    Constitutional: Well-developed, obese  female in no acute distress.  Cardiovascular: tachycardia  rate & rhythm, warm and well-perfused Respiratory: normal effort, no problems with respiration noted GI: Abd soft, non-tender, no RUQ pain on palpation MS: Extremities nontender, no edema, normal ROM Neurologic: Alert and oriented x 4.  GU: no CVA tenderness Pelvic: Deferred      Fetal Tracing: @ 2149 Cat 1 , reactive Baseline:140 Variability:moderate  Accelerations:  present Decelerations: absent Toco: irregular    Labs: Results for orders placed or performed during the hospital encounter of 06/01/23 (from the past 24 hours)  Pregnancy, urine POC     Status: Abnormal   Collection Time: 06/01/23  8:41 PM  Result Value Ref Range   Preg Test, Ur POSITIVE (A) NEGATIVE  Urinalysis, Routine w reflex microscopic -Urine, Clean Catch     Status: Abnormal   Collection Time:  06/01/23  8:52 PM  Result Value Ref Range   Color, Urine YELLOW YELLOW   APPearance CLEAR CLEAR   Specific Gravity, Urine 1.009 1.005 - 1.030   pH 7.0 5.0 - 8.0   Glucose, UA NEGATIVE NEGATIVE mg/dL   Hgb urine dipstick NEGATIVE NEGATIVE   Bilirubin Urine NEGATIVE NEGATIVE   Ketones, ur NEGATIVE NEGATIVE mg/dL   Protein, ur NEGATIVE NEGATIVE mg/dL   Nitrite NEGATIVE NEGATIVE   Leukocytes,Ua TRACE (A) NEGATIVE   RBC / HPF 0-5 0 - 5 RBC/hpf   WBC, UA 0-5 0 - 5 WBC/hpf   Bacteria, UA NONE SEEN NONE SEEN   Squamous Epithelial / HPF 0-5 0 - 5 /HPF   Mucus PRESENT   Protein / creatinine ratio, urine     Status: None   Collection Time: 06/01/23  8:52 PM  Result Value Ref Range   Creatinine, Urine 62 mg/dL   Total Protein, Urine <6 mg/dL   Protein Creatinine Ratio        0.00 - 0.15 mg/mg[Cre]  CBC     Status: Abnormal   Collection Time: 06/01/23 10:28 PM  Result Value Ref Range   WBC 8.7 4.0 - 10.5 K/uL   RBC 3.75 (L) 3.87 - 5.11 MIL/uL   Hemoglobin 9.4 (L) 12.0 - 15.0 g/dL   HCT 16.1 (L) 09.6 - 04.5 %   MCV 80.0 80.0 - 100.0 fL   MCH 25.1 (L) 26.0 - 34.0 pg   MCHC 31.3 30.0 - 36.0 g/dL   RDW 40.9 81.1 - 91.4 %   Platelets 226 150 - 400 K/uL   nRBC 0.0 0.0 - 0.2 %  Comprehensive metabolic panel     Status: Abnormal   Collection Time: 06/01/23 10:28 PM  Result Value Ref Range   Sodium 135 135 - 145 mmol/L   Potassium 3.5 3.5 - 5.1 mmol/L   Chloride 107 98 - 111 mmol/L   CO2 20 (L) 22 - 32 mmol/L   Glucose, Bld 83 70 - 99 mg/dL   BUN 5 (L) 6 - 20 mg/dL   Creatinine, Ser 7.82 0.44 - 1.00 mg/dL   Calcium 8.7 (L) 8.9 - 10.3 mg/dL   Total Protein 6.4 (L) 6.5 -  8.1 g/dL   Albumin 2.7 (L) 3.5 - 5.0 g/dL   AST 15 15 - 41 U/L   ALT 11 0 - 44 U/L   Alkaline Phosphatase 84 38 - 126 U/L   Total Bilirubin 0.5 0.0 - 1.2 mg/dL   GFR, Estimated >16 >10 mL/min   Anion gap 8 5 - 15    Imaging:  No results found.  MDM & MAU COURSE  MDM:   HIGH  gHTN - Cycle BP's  R/O PreE :  PreE labs ordered HA - IV Meds ordered N/V- Antiemetics ordered IV NST: Reactive , Cat 1 EKG: Tachardia - Otherwise NM sinus rhythm  Hgb = 9.4 - Rx sent for iron supplement  Reassessed at 0000 and patient reports her HA is gone and she feels better no longer Nauseas , tolerated PO Challenge . Plan to discharge home with precautions.   Increased  Labetalol  to 200 BID  d/t poorly controlled hypertension and advised BP check on Monday 06/04/23  in primary OB office   MAU Course: Orders Placed This Encounter  Procedures   Urinalysis, Routine w reflex microscopic -Urine, Clean Catch   Protein / creatinine ratio, urine   CBC   Comprehensive metabolic panel   Pregnancy, urine POC   EKG 12-Lead   Saline lock IV   Discharge patient Discharge disposition: 01-Home or Self Care; Discharge patient date: 06/02/2023   Meds ordered this encounter  Medications   diphenhydrAMINE  (BENADRYL ) injection 25 mg   metoCLOPramide (REGLAN) injection 10 mg   acetaminophen -caffeine  (EXCEDRIN TENSION HEADACHE) 500-65 MG per tablet 2 tablet   labetalol  (NORMODYNE ) tablet 100 mg   labetalol  (NORMODYNE ) 200 MG tablet    Sig: Take 1 tablet (200 mg total) by mouth 2 (two) times daily.    Dispense:  60 tablet    Refill:  1    Supervising Provider:   PRATT, TANYA S [[id number]]   ondansetron  (ZOFRAN ) 4 MG tablet    Sig: Take 2 tablets (8 mg total) by mouth 2 (two) times daily.    Dispense:  20 tablet    Refill:  0    Supervising Provider:   PRATT, TANYA S [[id number]]   Ferric Maltol (ACCRUFER) 30 MG CAPS    Sig: Take 1 capsule (30 mg total) by mouth daily.    Dispense:  30 capsule    Refill:  0    Supervising Provider:   PRATT, TANYA S [[id number]]    I have reviewed the patient chart and performed the physical exam . I have ordered & interpreted the lab results and reviewed and interpreted the NST Medications ordered as stated below.  A/P as described below.  Counseling and education provided and patient agreeable  with  plan as described below. Verbalized understanding.    ASSESSMENT   1. Hypertension affecting pregnancy in third trimester   2. Nonintractable headache, unspecified chronicity pattern, unspecified headache type   3. Nausea and vomiting during pregnancy   4. [redacted] weeks gestation of pregnancy   5. Anemia affecting pregnancy in third trimester     PLAN  Discharge home in stable condition with return precautions.   See AVS for full description of information given to the patient including both verbal and written. Patient verbalized understanding and agrees with the plan as described above.  Allergies as of 06/02/2023       Reactions   Carrot [daucus Carota] Swelling   Peanut-containing Drug Products Swelling        Medication List  STOP taking these medications    ibuprofen  600 MG tablet Commonly known as: ADVIL        TAKE these medications    ACCRUFeR 30 MG Caps Generic drug: Ferric Maltol Take 1 capsule (30 mg total) by mouth daily.   aspirin 81 MG chewable tablet Chew by mouth daily.   labetalol  200 MG tablet Commonly known as: NORMODYNE  Take 0.5 tablets (100 mg total) by mouth 2 (two) times daily. What changed: Another medication with the same name was added. Make sure you understand how and when to take each.   labetalol  200 MG tablet Commonly known as: NORMODYNE  Take 1 tablet (200 mg total) by mouth 2 (two) times daily. What changed: You were already taking a medication with the same name, and this prescription was added. Make sure you understand how and when to take each.   ondansetron  4 MG tablet Commonly known as: Zofran  Take 2 tablets (8 mg total) by mouth 2 (two) times daily.   prenatal multivitamin Tabs tablet Take 1 tablet by mouth daily at 12 noon.           Allergies as of 06/02/2023       Reactions   Carrot [daucus Carota] Swelling   Peanut-containing Drug Products Swelling        Medication List     STOP taking these medications     ibuprofen  600 MG tablet Commonly known as: ADVIL        TAKE these medications    ACCRUFeR 30 MG Caps Generic drug: Ferric Maltol Take 1 capsule (30 mg total) by mouth daily.   aspirin 81 MG chewable tablet Chew by mouth daily.   labetalol  200 MG tablet Commonly known as: NORMODYNE  Take 0.5 tablets (100 mg total) by mouth 2 (two) times daily. What changed: Another medication with the same name was added. Make sure you understand how and when to take each.   labetalol  200 MG tablet Commonly known as: NORMODYNE  Take 1 tablet (200 mg total) by mouth 2 (two) times daily. What changed: You were already taking a medication with the same name, and this prescription was added. Make sure you understand how and when to take each.   ondansetron  4 MG tablet Commonly known as: Zofran  Take 2 tablets (8 mg total) by mouth 2 (two) times daily.   prenatal multivitamin Tabs tablet Take 1 tablet by mouth daily at 12 noon.       Debbe Fail, MSN, [facility], [facility] [employer]

## 2023-06-02 DIAGNOSIS — Z3A33 33 weeks gestation of pregnancy: Secondary | ICD-10-CM

## 2023-06-02 DIAGNOSIS — O163 Unspecified maternal hypertension, third trimester: Secondary | ICD-10-CM

## 2023-06-02 MED ORDER — ACCRUFER 30 MG PO CAPS: 1.0000 | ORAL_CAPSULE | Freq: Every day | ORAL | 0 refills | Status: DC

## 2023-06-02 MED ORDER — ONDANSETRON HCL 4 MG PO TABS: 8.0000 mg | ORAL_TABLET | Freq: Two times a day (BID) | ORAL | 0 refills | Status: DC

## 2023-06-02 MED ORDER — LABETALOL HCL 200 MG PO TABS: 200.0000 mg | ORAL_TABLET | Freq: Two times a day (BID) | ORAL | 1 refills | Status: DC

## 2023-06-02 NOTE — Discharge Instructions (Signed)
 Follow up with your OB for BP check on Monday

## 2023-06-21 LAB — OB RESULTS CONSOLE GBS: GBS: NEGATIVE

## 2023-06-24 ENCOUNTER — Other Ambulatory Visit: Payer: Self-pay | Admitting: Obstetrics and Gynecology

## 2023-06-24 DIAGNOSIS — O10919 Unspecified pre-existing hypertension complicating pregnancy, unspecified trimester: Secondary | ICD-10-CM

## 2023-06-25 ENCOUNTER — Inpatient Hospital Stay (HOSPITAL_COMMUNITY)
Admission: RE | Admit: 2023-06-25 | Discharge: 2023-06-28 | DRG: 807 | Disposition: A | Discharge status: Home / Self Care | Source: Ambulatory Visit | Attending: Obstetrics and Gynecology | Admitting: Obstetrics and Gynecology

## 2023-06-25 ENCOUNTER — Other Ambulatory Visit: Payer: Self-pay

## 2023-06-25 ENCOUNTER — Encounter (HOSPITAL_COMMUNITY): Payer: Self-pay | Admitting: Obstetrics and Gynecology

## 2023-06-25 ENCOUNTER — Inpatient Hospital Stay (HOSPITAL_COMMUNITY)

## 2023-06-25 ENCOUNTER — Inpatient Hospital Stay (HOSPITAL_COMMUNITY): Admitting: Anesthesiology

## 2023-06-25 DIAGNOSIS — O26893 Other specified pregnancy related conditions, third trimester: Secondary | ICD-10-CM | POA: Diagnosis present

## 2023-06-25 DIAGNOSIS — O1092 Unspecified pre-existing hypertension complicating childbirth: Principal | ICD-10-CM | POA: Diagnosis present

## 2023-06-25 DIAGNOSIS — Z3A37 37 weeks gestation of pregnancy: Secondary | ICD-10-CM | POA: Diagnosis not present

## 2023-06-25 DIAGNOSIS — O99214 Obesity complicating childbirth: Secondary | ICD-10-CM | POA: Diagnosis present

## 2023-06-25 DIAGNOSIS — Z6791 Unspecified blood type, Rh negative: Secondary | ICD-10-CM | POA: Diagnosis not present

## 2023-06-25 DIAGNOSIS — O10919 Unspecified pre-existing hypertension complicating pregnancy, unspecified trimester: Principal | ICD-10-CM | POA: Diagnosis present

## 2023-06-25 LAB — CBC
HCT: 31 % — ABNORMAL LOW (ref 36.0–46.0)
HCT: 29.7 % — ABNORMAL LOW (ref 36.0–46.0)
HCT: 31.4 % — ABNORMAL LOW (ref 36.0–46.0)
Hemoglobin: 9.5 g/dL — ABNORMAL LOW (ref 12.0–15.0)
Hemoglobin: 9.2 g/dL — ABNORMAL LOW (ref 12.0–15.0)
Hemoglobin: 9.6 g/dL — ABNORMAL LOW (ref 12.0–15.0)
MCH: 24 pg — ABNORMAL LOW (ref 26.0–34.0)
MCH: 24.2 pg — ABNORMAL LOW (ref 26.0–34.0)
MCH: 24 pg — ABNORMAL LOW (ref 26.0–34.0)
MCHC: 30.6 g/dL (ref 30.0–36.0)
MCHC: 31 g/dL (ref 30.0–36.0)
MCHC: 30.6 g/dL (ref 30.0–36.0)
MCV: 78.3 fL — ABNORMAL LOW (ref 80.0–100.0)
MCV: 78.2 fL — ABNORMAL LOW (ref 80.0–100.0)
MCV: 78.5 fL — ABNORMAL LOW (ref 80.0–100.0)
Platelets: 287 10*3/uL (ref 150–400)
Platelets: 242 10*3/uL (ref 150–400)
Platelets: 241 10*3/uL (ref 150–400)
RBC: 3.96 MIL/uL (ref 3.87–5.11)
RBC: 3.8 MIL/uL — ABNORMAL LOW (ref 3.87–5.11)
RBC: 4 MIL/uL (ref 3.87–5.11)
RDW: 15.2 % (ref 11.5–15.5)
RDW: 15.3 % (ref 11.5–15.5)
RDW: 15.4 % (ref 11.5–15.5)
WBC: 9.7 10*3/uL (ref 4.0–10.5)
WBC: 9.4 10*3/uL (ref 4.0–10.5)
WBC: 12.7 10*3/uL — ABNORMAL HIGH (ref 4.0–10.5)
nRBC: 0 % (ref 0.0–0.2)
nRBC: 0 % (ref 0.0–0.2)
nRBC: 0 % (ref 0.0–0.2)

## 2023-06-25 LAB — COMPREHENSIVE METABOLIC PANEL WITH GFR
ALT: 12 U/L (ref 0–44)
AST: 17 U/L (ref 15–41)
Albumin: 2.8 g/dL — ABNORMAL LOW (ref 3.5–5.0)
Alkaline Phosphatase: 97 U/L (ref 38–126)
Anion gap: 10 (ref 5–15)
BUN: 8 mg/dL (ref 6–20)
CO2: 21 mmol/L — ABNORMAL LOW (ref 22–32)
Calcium: 9.1 mg/dL (ref 8.9–10.3)
Chloride: 104 mmol/L (ref 98–111)
Creatinine, Ser: 0.64 mg/dL (ref 0.44–1.00)
GFR, Estimated: >60 mL/min (ref >60)
Glucose, Bld: 92 mg/dL (ref 70–99)
Potassium: 3.6 mmol/L (ref 3.5–5.1)
Sodium: 135 mmol/L (ref 135–145)
Total Bilirubin: 0.4 mg/dL (ref 0.0–1.2)
Total Protein: 6.4 g/dL — ABNORMAL LOW (ref 6.5–8.1)

## 2023-06-25 LAB — PROTEIN / CREATININE RATIO, URINE
Creatinine, Urine: 341 mg/dL
Protein Creatinine Ratio: 0.07 mg/mg{creat} (ref 0.00–0.15)
Total Protein, Urine: 24 mg/dL

## 2023-06-25 LAB — RPR: RPR Ser Ql: NONREACTIVE

## 2023-06-25 LAB — TYPE AND SCREEN
ABO/RH(D): A NEG
Antibody Screen: POSITIVE

## 2023-06-25 MED ORDER — IBUPROFEN 600 MG PO TABS
600.0000 mg | ORAL_TABLET | Freq: Four times a day (QID) | ORAL | Status: DC
  Administered 2023-06-25 – 2023-06-28 (×10): 600 mg via ORAL
  Filled 2023-06-25 (×10): qty 1

## 2023-06-25 MED ORDER — LABETALOL HCL 200 MG PO TABS
400.0000 mg | ORAL_TABLET | Freq: Two times a day (BID) | ORAL | Status: DC
  Administered 2023-06-25 – 2023-06-27 (×5): 400 mg via ORAL
  Filled 2023-06-25 (×6): qty 2

## 2023-06-25 MED ORDER — MISOPROSTOL 25 MCG QUARTER TABLET: 25.0000 ug | ORAL_TABLET | ORAL | Status: DC | PRN

## 2023-06-25 MED ORDER — BUPIVACAINE HCL (PF) 0.25 % IJ SOLN
INTRAMUSCULAR | Status: DC | PRN
  Administered 2023-06-25: 10 mL via EPIDURAL

## 2023-06-25 MED ORDER — DIPHENHYDRAMINE HCL 50 MG/ML IJ SOLN: 12.5000 mg | INTRAMUSCULAR | Status: DC | PRN

## 2023-06-25 MED ORDER — ONDANSETRON HCL 4 MG/2ML IJ SOLN: 4.0000 mg | Freq: Four times a day (QID) | INTRAMUSCULAR | Status: DC | PRN

## 2023-06-25 MED ORDER — PHENYLEPHRINE 80 MCG/ML (10ML) SYRINGE FOR IV PUSH (FOR BLOOD PRESSURE SUPPORT): 80.0000 ug | PREFILLED_SYRINGE | INTRAVENOUS | Status: DC | PRN

## 2023-06-25 MED ORDER — TERBUTALINE SULFATE 1 MG/ML IJ SOLN: 0.2500 mg | Freq: Once | INTRAMUSCULAR | Status: DC | PRN

## 2023-06-25 MED ORDER — ACETAMINOPHEN 325 MG PO TABS
650.0000 mg | ORAL_TABLET | ORAL | Status: DC | PRN
  Administered 2023-06-26 – 2023-06-28 (×2): 650 mg via ORAL
  Filled 2023-06-25 (×2): qty 2

## 2023-06-25 MED ORDER — EPHEDRINE 5 MG/ML INJ: 10.0000 mg | INTRAVENOUS | Status: DC | PRN

## 2023-06-25 MED ORDER — LACTATED RINGERS IV SOLN: 500.0000 mL | Freq: Once | INTRAVENOUS | Status: DC

## 2023-06-25 MED ORDER — OXYCODONE HCL 5 MG PO TABS: 10.0000 mg | ORAL_TABLET | ORAL | Status: DC | PRN

## 2023-06-25 MED ORDER — SIMETHICONE 80 MG PO CHEW: 80.0000 mg | CHEWABLE_TABLET | ORAL | Status: DC | PRN

## 2023-06-25 MED ORDER — LIDOCAINE HCL (PF) 1 % IJ SOLN: 30.0000 mL | INTRAMUSCULAR | Status: DC | PRN

## 2023-06-25 MED ORDER — DIBUCAINE (PERIANAL) 1 % EX OINT: 1.0000 | TOPICAL_OINTMENT | CUTANEOUS | Status: DC | PRN

## 2023-06-25 MED ORDER — OXYTOCIN BOLUS FROM INFUSION
333.0000 mL | Freq: Once | INTRAVENOUS | Status: AC
  Administered 2023-06-25: 333 mL via INTRAVENOUS

## 2023-06-25 MED ORDER — OXYTOCIN-SODIUM CHLORIDE 30-0.9 UT/500ML-% IV SOLN
1.0000 m[IU]/min | INTRAVENOUS | Status: DC
  Administered 2023-06-25: 2 m[IU]/min via INTRAVENOUS

## 2023-06-25 MED ORDER — SOD CITRATE-CITRIC ACID 500-334 MG/5ML PO SOLN: 30.0000 mL | ORAL | Status: DC | PRN

## 2023-06-25 MED ORDER — PRENATAL MULTIVITAMIN CH
1.0000 | ORAL_TABLET | Freq: Every day | ORAL | Status: DC
  Administered 2023-06-26 – 2023-06-27 (×2): 1 via ORAL
  Filled 2023-06-25 (×2): qty 1

## 2023-06-25 MED ORDER — FENTANYL CITRATE (PF) 100 MCG/2ML IJ SOLN: 50.0000 ug | INTRAMUSCULAR | Status: DC | PRN

## 2023-06-25 MED ORDER — OXYCODONE HCL 5 MG PO TABS: 5.0000 mg | ORAL_TABLET | ORAL | Status: DC | PRN

## 2023-06-25 MED ORDER — COCONUT OIL OIL: 1.0000 | TOPICAL_OIL | Status: DC | PRN

## 2023-06-25 MED ORDER — BISACODYL 10 MG RE SUPP: 10.0000 mg | Freq: Every day | RECTAL | Status: DC | PRN

## 2023-06-25 MED ORDER — FLEET ENEMA RE ENEM: 1.0000 | ENEMA | Freq: Every day | RECTAL | Status: DC | PRN

## 2023-06-25 MED ORDER — OXYCODONE-ACETAMINOPHEN 5-325 MG PO TABS: 2.0000 | ORAL_TABLET | ORAL | Status: DC | PRN

## 2023-06-25 MED ORDER — LABETALOL HCL 200 MG PO TABS: 400.0000 mg | ORAL_TABLET | Freq: Two times a day (BID) | ORAL | Status: DC

## 2023-06-25 MED ORDER — LACTATED RINGERS IV SOLN: INTRAVENOUS | Status: DC

## 2023-06-25 MED ORDER — OXYTOCIN-SODIUM CHLORIDE 30-0.9 UT/500ML-% IV SOLN
2.5000 [IU]/h | INTRAVENOUS | Status: DC
  Filled 2023-06-25: qty 500

## 2023-06-25 MED ORDER — TETANUS-DIPHTH-ACELL PERTUSSIS 5-2.5-18.5 LF-MCG/0.5 IM SUSY: 0.5000 mL | PREFILLED_SYRINGE | Freq: Once | INTRAMUSCULAR | Status: DC

## 2023-06-25 MED ORDER — ACETAMINOPHEN 325 MG PO TABS: 650.0000 mg | ORAL_TABLET | ORAL | Status: DC | PRN

## 2023-06-25 MED ORDER — ONDANSETRON HCL 4 MG/2ML IJ SOLN: 4.0000 mg | INTRAMUSCULAR | Status: DC | PRN

## 2023-06-25 MED ORDER — BENZOCAINE-MENTHOL 20-0.5 % EX AERO
1.0000 | INHALATION_SPRAY | CUTANEOUS | Status: DC | PRN
  Administered 2023-06-26: 1 via TOPICAL
  Filled 2023-06-25: qty 56

## 2023-06-25 MED ORDER — DOCUSATE SODIUM 100 MG PO CAPS
100.0000 mg | ORAL_CAPSULE | Freq: Two times a day (BID) | ORAL | Status: DC
  Administered 2023-06-26 – 2023-06-28 (×5): 100 mg via ORAL
  Filled 2023-06-25 (×6): qty 1

## 2023-06-25 MED ORDER — DIPHENHYDRAMINE HCL 25 MG PO CAPS: 25.0000 mg | ORAL_CAPSULE | Freq: Four times a day (QID) | ORAL | Status: DC | PRN

## 2023-06-25 MED ORDER — FENTANYL-BUPIVACAINE-NACL 0.5-0.125-0.9 MG/250ML-% EP SOLN
12.0000 mL/h | EPIDURAL | Status: DC | PRN
  Administered 2023-06-25: 12 mL/h via EPIDURAL
  Filled 2023-06-25: qty 250

## 2023-06-25 MED ORDER — OXYCODONE-ACETAMINOPHEN 5-325 MG PO TABS: 1.0000 | ORAL_TABLET | ORAL | Status: DC | PRN

## 2023-06-25 MED ORDER — LIDOCAINE HCL (PF) 1 % IJ SOLN
INTRAMUSCULAR | Status: DC | PRN
  Administered 2023-06-25 (×2): 4 mL via EPIDURAL

## 2023-06-25 MED ORDER — ONDANSETRON HCL 4 MG PO TABS: 4.0000 mg | ORAL_TABLET | ORAL | Status: DC | PRN

## 2023-06-25 MED ORDER — LACTATED RINGERS IV SOLN: 500.0000 mL | INTRAVENOUS | Status: DC | PRN

## 2023-06-25 MED ORDER — WITCH HAZEL-GLYCERIN EX PADS: 1.0000 | MEDICATED_PAD | CUTANEOUS | Status: DC | PRN

## 2023-06-25 NOTE — Anesthesia Preprocedure Evaluation (Signed)
 Anesthesia Evaluation  Patient identified by MRN, date of birth, ID band Patient awake    Reviewed: Allergy & Precautions, Patient's Chart, lab work & pertinent test results, reviewed documented beta blocker date and time   History of Anesthesia Complications Negative for: history of anesthetic complications  Airway Mallampati: II  TM Distance: >3 FB Neck ROM: Full    Dental no notable dental hx.    Pulmonary neg pulmonary ROS   Pulmonary exam normal        Cardiovascular hypertension, Pt. on home beta blockers and Pt. on medications Normal cardiovascular exam     Neuro/Psych negative neurological ROS     GI/Hepatic negative GI ROS, Neg liver ROS,,,  Endo/Other  negative endocrine ROS    Renal/GU negative Renal ROS     Musculoskeletal negative musculoskeletal ROS (+)    Abdominal   Peds  Hematology  (+) Blood dyscrasia (Hgb 9.2), anemia   Anesthesia Other Findings Day of surgery medications reviewed with patient.  Reproductive/Obstetrics (+) Pregnancy                             Anesthesia Physical Anesthesia Plan  ASA: 2  Anesthesia Plan: Epidural   Post-op Pain Management: Minimal or no pain anticipated   Induction:   PONV Risk Score and Plan: Treatment may vary due to age or medical condition  Airway Management Planned: Natural Airway  Additional Equipment: Fetal Monitoring  Intra-op Plan:   Post-operative Plan:   Informed Consent: I have reviewed the patients History and Physical, chart, labs and discussed the procedure including the risks, benefits and alternatives for the proposed anesthesia with the patient or authorized representative who has indicated his/her understanding and acceptance.       Plan Discussed with:   Anesthesia Plan Comments:        Anesthesia Quick Evaluation

## 2023-06-25 NOTE — Progress Notes (Signed)
 OB Progress Note  S: resting, some pelvic pressure   O: Today's Vitals   06/25/23 1331 06/25/23 1401 06/25/23 1431 06/25/23 1501  BP: (!) 129/93 (!) 134/97 133/83 122/83  Pulse: (!) 103 92 92 94  Resp:      Temp:      TempSrc:      SpO2:      Weight:      Height:      PainSc: Asleep 0-No pain 0-No pain 0-No pain   Body mass index is 39.19 kg/m.  SVE 5/80/-1  FHR: 135bpm, mod variability, + accels, no decels Toco: ctx q 2 mins   A/P: 27Y G3P1011 @ [redacted]w[redacted]d, IOL CHTN 1. Fetal wellbeing: cat I tracing 2. IOL: continue pitocin , s/p AROM, anticipate SVD 3. Pain control: epidural 4. CHTN: BP had increased post-epidural so restarted labetalol  400mg  BID with good response.  Admission labs normal.          Theodoro Fisherman 06/25/2023, 3:46 PM

## 2023-06-25 NOTE — Anesthesia Procedure Notes (Signed)
 Epidural Patient location during procedure: OB Start time: 06/25/2023 11:13 AM End time: 06/25/2023 11:16 AM  Staffing Anesthesiologist: Vernadine Golas, MD Performed: anesthesiologist   Preanesthetic Checklist Completed: patient identified, IV checked, risks and benefits discussed, monitors and equipment checked, pre-op evaluation and timeout performed  Epidural Patient position: sitting Prep: DuraPrep and site prepped and draped Patient monitoring: continuous pulse ox, blood pressure and heart rate Approach: midline Location: L3-L4 Injection technique: LOR air  Needle:  Needle type: Tuohy  Needle gauge: 17 G Needle length: 9 cm Needle insertion depth: 6 cm Catheter type: closed end flexible Catheter size: 19 Gauge Catheter at skin depth: 11 cm Test dose: negative and Other (1% lidocaine )  Assessment Events: blood not aspirated, no cerebrospinal fluid, injection not painful, no injection resistance, no paresthesia and negative IV test  Additional Notes Patient identified. Risks, benefits, and alternatives discussed with patient including but not limited to bleeding, infection, nerve damage, paralysis, failed block, incomplete pain control, headache, blood pressure changes, nausea, vomiting, reactions to medication, itching, and postpartum back pain. Confirmed with bedside nurse the patient's most recent platelet count. Confirmed with patient that they are not currently taking any anticoagulation, have any bleeding history, or any family history of bleeding disorders. Patient expressed understanding and wished to proceed. All questions were answered. Sterile technique was used throughout the entire procedure. Please see nursing notes for vital signs.   Crisp LOR on first pass. Test dose was given through epidural catheter and negative prior to continuing to dose epidural or start infusion. Warning signs of high block given to the patient including shortness of breath,  tingling/numbness in hands, complete motor block, or any concerning symptoms with instructions to call for help. Patient was given instructions on fall risk and not to get out of bed. All questions and concerns addressed with instructions to call with any issues or inadequate analgesia.  Reason for block:procedure for pain

## 2023-06-25 NOTE — Lactation Note (Signed)
 This note was copied from a baby's chart. Lactation Consultation Note  Patient Name: Diana Burke Today's Date: 06/25/2023 Age:27 hours Reason for consult: Initial assessment;NICU baby;Early term 37-38.6wks  P2- MOB declines LC services due to being experienced with breastfeeding. MOB agreed to this [facility] setting up the DEBP for her in Couplet Care. LC set up the DEBP and reviewed the pump settings with FOB because MOB was still in L&D. Per FOB, MOB does have a home pump to use as well. LC encouraged FOB to have MOB call for any assistance while admitted to the hospital.  Maternal Data Does the patient have breastfeeding experience prior to this delivery?: Yes  Feeding Mother's Current Feeding Choice: Breast Milk  Lactation Tools Discussed/Used Tools: Pump;Flanges Breast pump type: Double-Electric Breast Pump;Manual Pump Education: Setup, frequency, and cleaning;Milk Storage Reason for Pumping: Infant in NICU Pumping frequency: 15-20 min every 3 hrs  Interventions Interventions: Hand pump;DEBP;LC Services brochure  Discharge Discharge Education: Engorgement and breast care;Warning signs for feeding baby Pump: DEBP;Personal  Consult Status Consult Status: Complete (mother declined follow up) Date: 06/25/23    Vernette Goo BS, IBCLC 06/25/2023, 10:32 PM

## 2023-06-25 NOTE — H&P (Signed)
 Diana Burke is a 28 y.o. female presenting for scheduled induction of labor for chronic hypertension.  Started on pitocin  at 0130 this morning. Feeling increasing strength of contractions. No LOF or VB. Good fetal movement.   Pregnancy complicated by: Chronic hypertension: started on labetalol  at 31 weeks and have been titrating up to current dose of 400mg  BID  Obesity; initial BMI 36, most recent growth US  06/05/23: 2471g (5#7) 53%. Pelvis proven to 7lb11oz.  Rh neg: received Rhogam 04/19/23 OB History     Gravida  3   Para  1   Term  1   Preterm      AB  1   Living  1      SAB  1   IAB      Ectopic      Multiple  0   Live Births  1          Past Medical History:  Diagnosis Date   Anemia    Astigmatism of both eyes 10/02/2012   Hypertension    Past Surgical History:  Procedure Laterality Date   DILATION AND EVACUATION N/A 09/02/2021   Procedure: DILATATION AND EVACUATION;  Surgeon: Rogene Claude, MD;  Location: [facility] OR;  Service: Gynecology;  Laterality: N/A;   Family History: family history includes Cancer in her paternal grandmother. Social History:  reports that she has never smoked. She has never used smokeless tobacco. She reports that she does not currently use alcohol. She reports that she does not currently use drugs.     Maternal Diabetes: No Genetic Screening: Normal Maternal Ultrasounds/Referrals: Normal Fetal Ultrasounds or other Referrals:  None Maternal Substance Abuse:  No Significant Maternal Medications:  Meds include: Other: labetalol   Significant Maternal Lab Results:  Group B Strep negative Number of Prenatal Visits:greater than 3 verified prenatal visits Maternal Vaccinations: declines  Other Comments:  None  Review of Systems History Dilation: 4 Effacement (%): 70 Station: -2, -1 Exam by:: Terra Aveni Blood pressure 114/88, pulse (!) 107, temperature 98.1 F (36.7 C), temperature source Oral, resp. rate 18, height 5\' 8"   (1.727 m), weight 116.9 kg, SpO2 99%, unknown if currently breastfeeding. Exam Physical Exam   Gen: well appearing CVS: normal pulses Lungs: nonlabored respirations Abd: gravid, Leopolds 7.5lbs Ext: no calf edema or tenderness  NST: 135bpm, mod variability, + accels, no decels Toco:  q 2-  CBC    Component Value Date/Time   WBC 9.7 06/25/2023 0030   RBC 3.96 06/25/2023 0030   HGB 9.5 (L) 06/25/2023 0030   HCT 31.0 (L) 06/25/2023 0030   PLT 287 06/25/2023 0030   MCV 78.3 (L) 06/25/2023 0030   MCH 24.0 (L) 06/25/2023 0030   MCHC 30.6 06/25/2023 0030   RDW 15.2 06/25/2023 0030   LYMPHSABS 0.9 07/23/2022 2335   MONOABS 1.1 (H) 07/23/2022 2335   EOSABS 0.0 07/23/2022 2335   BASOSABS 0.0 07/23/2022 2335    CMP     Component Value Date/Time   NA 135 06/25/2023 0030   K 3.6 06/25/2023 0030   CL 104 06/25/2023 0030   CO2 21 (L) 06/25/2023 0030   GLUCOSE 92 06/25/2023 0030   BUN 8 06/25/2023 0030   CREATININE 0.64 06/25/2023 0030   CALCIUM 9.1 06/25/2023 0030   PROT 6.4 (L) 06/25/2023 0030   ALBUMIN 2.8 (L) 06/25/2023 0030   AST 17 06/25/2023 0030   ALT 12 06/25/2023 0030   ALKPHOS 97 06/25/2023 0030   BILITOT 0.4 06/25/2023 0030   GFRNONAA >  60 06/25/2023 0030    Latest Reference Range & Units 06/25/23 00:30  Protein Creatinine Ratio 0.00 - 0.15 mg/mgCre 0.07    Prenatal labs: ABO, Rh: --/--/A NEG (05/20 0030) Antibody: POS (05/20 0030) Rubella: Immune (11/18 0000) RPR: NON REACTIVE (05/20 0030)  HBsAg: Negative (11/18 0000)  HIV: Non-reactive (11/18 0000)  GBS: Negative/-- (05/16 0000)   Assessment/Plan: 27Y G3P1011 @ [redacted]w[redacted]d, IOL CHTN 1. Fetal wellbeing: cat I tracing 2. IOL: continue pitocin , titrate PRN. Plan AROM but desires epidural prior to AROM.  3. Pain control: requesting epidural 4. CHTN: normal to mild range blood pressures on admission, holding labetalol  400mg  BID for now and will restart if needed. Admission labs normal.    Theodoro Fisherman 06/25/2023, 10:36 AM

## 2023-06-26 LAB — CBC
HCT: 26.8 % — ABNORMAL LOW (ref 36.0–46.0)
Hemoglobin: 8.2 g/dL — ABNORMAL LOW (ref 12.0–15.0)
MCH: 23.6 pg — ABNORMAL LOW (ref 26.0–34.0)
MCHC: 30.6 g/dL (ref 30.0–36.0)
MCV: 77 fL — ABNORMAL LOW (ref 80.0–100.0)
Platelets: 221 10*3/uL (ref 150–400)
RBC: 3.48 MIL/uL — ABNORMAL LOW (ref 3.87–5.11)
RDW: 15.3 % (ref 11.5–15.5)
WBC: 10.2 10*3/uL (ref 4.0–10.5)
nRBC: 0 % (ref 0.0–0.2)

## 2023-06-26 MED ORDER — RHO D IMMUNE GLOBULIN 1500 UNIT/2ML IJ SOSY
300.0000 ug | PREFILLED_SYRINGE | Freq: Once | INTRAMUSCULAR | Status: AC
  Administered 2023-06-26: 300 ug via INTRAVENOUS
  Filled 2023-06-26: qty 2

## 2023-06-26 NOTE — Social Work (Signed)
 Patient screened out for psychosocial assessment since none of the following apply: Psychosocial stressors documented in mother or baby's chart Gestation less than 32 weeks Code at delivery  Infant with anomalies Please contact the Clinical Social Worker if specific needs arise, by MOB's request, or if MOB scores greater than 9/yes to question 10 on Edinburgh Postpartum Depression Screen. MOB completed [place] with a score of:0.  Nickolas Barr, MSW, LCSW Clinical Social Worker  [phone] 06/26/2023  12:38 PM

## 2023-06-26 NOTE — Anesthesia Postprocedure Evaluation (Signed)
 Anesthesia Post Note  Patient: Diana Burke  Procedure(s) Performed: AN AD HOC LABOR EPIDURAL     Patient location during evaluation: Mother Baby Anesthesia Type: Epidural Level of consciousness: awake and alert Pain management: pain level controlled Vital Signs Assessment: post-procedure vital signs reviewed and stable Respiratory status: spontaneous breathing, nonlabored ventilation and respiratory function stable Cardiovascular status: stable Postop Assessment: no headache, no backache and epidural receding Anesthetic complications: no   No notable events documented.  Last Vitals:  Vitals:   06/26/23 0008 06/26/23 0425  BP: 125/87 119/82  Pulse: (!) 101 96  Resp:  18  Temp:  37.1 C  SpO2:  100%    Last Pain:  Vitals:   06/26/23 0533  TempSrc:   PainSc: 0-No pain   Pain Goal:                   Diana Burke

## 2023-06-26 NOTE — Progress Notes (Signed)
 POSTPARTUM PROGRESS NOTE  Post Partum Day #1  Subjective:  No acute events overnight.  Pt denies problems with ambulating, voiding or po intake.  She denies nausea or vomiting.  Pain is well controlled.    Lochia Minimal. Wants to pump for baby, denies PreE symptoms  Objective: Blood pressure 122/78, pulse 84, temperature 98.2 F (36.8 C), temperature source Oral, resp. rate 16, height 5\' 8"  (1.727 m), weight 116.9 kg, SpO2 100%, unknown if currently breastfeeding.  Physical Exam:  General: alert, cooperative and no distress Lochia:normal flow Chest: CTAB Heart: RRR no m/r/g Abdomen: +BS, soft, nontender Uterine Fundus: firm, 2cm below umbilicus GU: suture intact, healing well, no purulent drainage Extremities: neg edema, neg calf TTP BL, neg Homans BL  Recent Labs    06/25/23 2201 06/26/23 0811  HGB 9.6* 8.2*  HCT 31.4* 26.8*    Assessment/Plan:  ASSESSMENT: Diana Burke is a 28 y.o. [id number] s/p SVD @ [redacted]w[redacted]d. PNC c/b CHTN on Rx, Class 2 obesity, Rh neg.   Breastfeeding, Lactation consult, and Circumcision prior to discharge CHTN - BP controlled on Lab 400mg  BID, asymptomatic. Continue to monitor Circ today   LOS: 1 day

## 2023-06-27 LAB — RH IG WORKUP (INCLUDES ABO/RH)
Fetal Screen: NEGATIVE
Gestational Age(Wks): 37
Unit division: 0

## 2023-06-27 MED ORDER — LABETALOL HCL 300 MG PO TABS
600.0000 mg | ORAL_TABLET | Freq: Two times a day (BID) | ORAL | Status: DC
  Administered 2023-06-27 – 2023-06-28 (×2): 600 mg via ORAL
  Filled 2023-06-27 (×2): qty 2

## 2023-06-27 NOTE — Progress Notes (Addendum)
 Patient is doing well.  She is ambulating, voiding, tolerating PO.  Pain control is good.  Lochia is appropriate  Vitals:   06/26/23 1159 06/26/23 2030 06/27/23 0500 06/27/23 0822  BP: 110/74 131/86 (!) 144/95 (!) (P) 144/96  Pulse: 80 91 96 (P) 79  Resp: 16 17 16  (P) 16  Temp: 98 F (36.7 C) 98.7 F (37.1 C) 97.9 F (36.6 C) (P) 98 F (36.7 C)  TempSrc: Oral Oral Oral (P) Oral  SpO2:  100%    Weight:      Height:        NAD Fundus firm Ext: trace pedal edema   Lab Results  Component Value Date   WBC 10.2 06/26/2023   HGB 8.2 (L) 06/26/2023   HCT 26.8 (L) 06/26/2023   MCV 77.0 (L) 06/26/2023   PLT 221 06/26/2023    --/--/A NEG (05/20 0030)  A/P 27 y.o. [id number] PPD#2 s/p SVD at 37 weeks for [facility]. Routine postpartum care.   CHTN: On labetalol  400 mg BID.  BPs well controlled yesterday but trending up today.  Will monitor closely this AM.  If return to baseline, consider PM d/c.  If continue to be elevated, will increase labetalol  dose Anemia of pregnancy: hgb 9.9 in third trimester, 8.2 now.  Asymptomatic.  Not significant for this admission.  Iron every other day at discharge Rh negative: received rhogam yesterday Expect d/c 1-2 days pending BP.   Baby in NICU--circumcision when able  [facility] GEFFEL Moustafa Mossa   Addendum:  BPs still ranging 130s-140s/high 80s-90s--is above her baseline during late pregnancy.  Will increase labetalol  to 600mg  BID and monitor an extra night   Vitals:   06/27/23 0822 06/27/23 1045 06/27/23 1240 06/27/23 1433  BP: (!) 144/96 130/89 (!) 140/86 138/84  Pulse: 79     Resp: 16     Temp: 98 F (36.7 C)     TempSrc: Oral     SpO2:      Weight:      Height:

## 2023-06-27 NOTE — Lactation Note (Signed)
 This note was copied from a baby's chart.  NICU Lactation Consultation Note  Patient Name: Diana Burke Date: 06/27/2023 Age:27 hours  Reason for consult: Follow-up assessment; NICU baby; Early term 77-38.6wks; Other (Comment) (Chronic HBP, on labetalol )  SUBJECTIVE LC in to visit with P2 Mom of baby "Diana Burke" delivered vaginally and in the NICU on respiratory support.  Baby currently on 4L/min 28% O2 and on gavage feedings.  Mom has pumped twice so far.  She was planning to pump when she got full.  Talked about the purpose of consistent pumping to stimulate her milk producing hormones.  Reassured Mom that this was temporary until baby is able to go to the breast.    LC provided Mom with a hands free pumping top and assisted her to pump using 18 mm flanges.  Mom encouraged to do hands-on pumping, with a goal of pumping every 3 hrs as a bridge to breastfeeding baby.  LC set up a washing and a drying basin and provided education on washing pump parts.  CDC guidelines handout provided.    Plan recommended- 1- STS with baby when able to 2- breast massage and hand expression 3- Pump both breasts every 3 hrs with a goal of 8 times per 24 hrs 4- ask for assistance prn.    OBJECTIVE Infant data: Mother's Current Feeding Choice: Breast Milk and Donor Milk  O2 Device: HHFNC O2 Flow Rate (L/min): 4 L/min (increased per order) FiO2 (%): 28 %  Infant feeding assessment IDFTS - Readiness: 3 IDFTS - Quality: 4   Maternal data: [id number] Vaginal, Spontaneous Has patient been taught Hand Expression?: Yes Hand Expression Comments: ++ drops Significant Breast History:: + breast changes Current breast feeding challenges:: infant in NICU for respiratory support Does the patient have breastfeeding experience prior to this delivery?: Yes How long did the patient breastfeed?: 6 months Pumping frequency: Mom has pumped twice.  Encouraged to pump 8 times per 24 hrs or every 3 hrs. Pumped  volume: 0 mL (drops) Flange Size: 18 Hands-free pumping top sizes: Large Diana Burke) Risk factor for low/delayed milk supply:: Delay in breast stimulation  Pump: Personal, DEBP, Hands Free (MomCozy and Luna Motif)  ASSESSMENT Infant:  Feeding Status: Scheduled 8-11-2-5 Feeding method: Tube/Gavage (Bolus)  Maternal: Milk volume: Normal  INTERVENTIONS/PLAN Interventions: Interventions: Breast feeding basics reviewed; Skin to skin; Breast massage; Hand express; DEBP; Education; [employer] brochure; CDC Guidelines for Breast Pump Cleaning Tools: Pump; Flanges; Hands-free pumping top Pump Education: Setup, frequency, and cleaning; Milk Storage  Plan: Consult Status: NICU follow-up NICU Follow-up type: Verify onset of copious milk; Verify absence of engorgement; Maternal D/C visit   Diana Burke 06/27/2023, 11:08 AM

## 2023-06-28 ENCOUNTER — Other Ambulatory Visit (HOSPITAL_COMMUNITY): Payer: Self-pay

## 2023-06-28 MED ORDER — LABETALOL HCL 300 MG PO TABS
600.0000 mg | ORAL_TABLET | Freq: Two times a day (BID) | ORAL | 1 refills | Status: AC
  Filled 2023-06-28: qty 30, 8d supply, fill #0

## 2023-06-28 MED ORDER — IBUPROFEN 600 MG PO TABS
600.0000 mg | ORAL_TABLET | Freq: Four times a day (QID) | ORAL | 0 refills | Status: AC
  Filled 2023-06-28: qty 30, 8d supply, fill #0

## 2023-06-28 MED ORDER — ACCRUFER 30 MG PO CAPS
1.0000 | ORAL_CAPSULE | Freq: Every day | ORAL | 1 refills | Status: AC
  Filled 2023-06-28: qty 30, 30d supply, fill #0

## 2023-06-28 NOTE — Progress Notes (Signed)
 Discharge education reviewed with patient. Patient to remain with infant in the NICU. Diana Burke, Diana Burke

## 2023-06-28 NOTE — Discharge Instructions (Signed)
 Call office with any concerns [phone]

## 2023-06-28 NOTE — Progress Notes (Signed)
 Post Partum Day 3 Subjective: Feeling well this AM. When BP is elevated, she feels HA, dizzy, flushed. That has resolved since increase of BP meds yesterday. Pain is controlled. Voiding and tolerating PO.  Objective: Blood pressure (!) 123/96, pulse 91, temperature 98.8 F (37.1 C), temperature source Oral, resp. rate 16, height 5\' 8"  (1.727 m), weight 116.9 kg, SpO2 99%, unknown if currently breastfeeding.  Physical Exam:  General: alert, cooperative, and no distress Lochia: appropriate Uterine Fundus: firm DVT Evaluation: No evidence of DVT seen on physical exam. No significant calf/ankle edema.  Recent Labs    06/25/23 2201 06/26/23 0811  HGB 9.6* 8.2*  HCT 31.4* 26.8*    Assessment/Plan: A/P 28 y.o. [id number] PPD#3 s/p SVD at 37 weeks for [facility]. Routine postpartum care.   CHTN: On labetalol  600 mg BID.  BP controlled.  Anemia of pregnancy: hgb 9.9 in third trimester, 8.2 now.  Asymptomatic.  Not significant for this admission.  Iron every other day at discharge Rh negative: s/p Rhogam PP Dispo: DC home today Baby in NICU--circumcision when able   LOS: 3 days   Leanne Pronto, DO 06/28/2023, 9:52 AM

## 2023-06-28 NOTE — Discharge Summary (Signed)
 Postpartum Discharge Summary  Date of Service updated     Patient Name: Diana Burke DOB: 1995/09/08 MRN: [id number]  Date of admission: 06/25/2023 Delivery date:06/25/2023 Delivering provider: MARINONE, MICHELLE E Date of discharge: 06/28/2023  Admitting diagnosis: Chronic hypertension affecting pregnancy [O10.919] Intrauterine pregnancy: [redacted]w[redacted]d     Secondary diagnosis:  Principal Problem:   Chronic hypertension affecting pregnancy  Additional problems: Anemia in pregnancy, maternal obesity, Rh negative status    Discharge diagnosis: Term Pregnancy Delivered, CHTN, and Anemia                                              Post partum procedures:rhogam Augmentation: AROM and Pitocin  Complications: None  Hospital course: Induction of Labor With Vaginal Delivery   28 y.o. yo [id number] at [redacted]w[redacted]d was admitted to the hospital 06/25/2023 for induction of labor.  Indication for induction: cHTN on medication.  Patient had an uncomplicated labor course Membrane Rupture Time/Date: 11:33 AM,06/25/2023  Delivery Method:Vaginal, Spontaneous Operative Delivery:N/A Episiotomy: None Lacerations:  1st degree Details of delivery can be found in separate delivery note.  Her infant was admitted to NICU for respiratory support. In the postpartum setting her antihypertensives were up-titrated until adequate control achieved. Patient is discharged home 06/28/23.  Newborn Data: Birth date:06/25/2023 Birth time:8:36 PM Gender:Female Living status:Living Apgars:8 ,7  Weight:3060 g  Magnesium Sulfate received: No BMZ received: No Rhophylac :Yes MMR:N/A T-DaP:declines Flu: No RSV Vaccine received: No Transfusion:No Immunizations administered: Immunization History  Administered Date(s) Administered   Tdap 02/22/2019    Physical exam  Vitals:   06/27/23 1433 06/27/23 1933 06/28/23 0500 06/28/23 0929  BP: 138/84 (!) 133/92 (!) 145/91 (!) 123/96  Pulse:  85 76 91  Resp:  17 16 16   Temp:  98.6  F (37 C) 98 F (36.7 C) 98.8 F (37.1 C)  TempSrc:  Oral Oral Oral  SpO2:  99% 100% 99%  Weight:      Height:       General: alert, cooperative, and no distress Lochia: appropriate Uterine Fundus: firm Incision: N/A DVT Evaluation: No evidence of DVT seen on physical exam. No significant calf/ankle edema. Labs: Lab Results  Component Value Date   WBC 10.2 06/26/2023   HGB 8.2 (L) 06/26/2023   HCT 26.8 (L) 06/26/2023   MCV 77.0 (L) 06/26/2023   PLT 221 06/26/2023      Latest Ref Rng & Units 06/25/2023   12:30 AM  CMP  Glucose 70 - 99 mg/dL 92   BUN 6 - 20 mg/dL 8   Creatinine 4.33 - 2.95 mg/dL 1.88   Sodium 416 - 606 mmol/L 135   Potassium 3.5 - 5.1 mmol/L 3.6   Chloride 98 - 111 mmol/L 104   CO2 22 - 32 mmol/L 21   Calcium 8.9 - 10.3 mg/dL 9.1   Total Protein 6.5 - 8.1 g/dL 6.4   Total Bilirubin 0.0 - 1.2 mg/dL 0.4   Alkaline Phos 38 - 126 U/L 97   AST 15 - 41 U/L 17   ALT 0 - 44 U/L 12    Edinburgh Score:    06/26/2023   10:30 AM  Edinburgh Postnatal Depression Scale Screening Tool  I have been able to laugh and see the funny side of things. 0  I have looked forward with enjoyment to things. 0  I have blamed myself unnecessarily when  things went wrong. 0  I have been anxious or worried for no good reason. 0  I have felt scared or panicky for no good reason. 0  Things have been getting on top of me. 0  I have been so unhappy that I have had difficulty sleeping. 0  I have felt sad or miserable. 0  I have been so unhappy that I have been crying. 0  The thought of harming myself has occurred to me. 0  Edinburgh Postnatal Depression Scale Total 0      After visit meds:  Allergies as of 06/28/2023       Reactions   Carrot [daucus Carota] Swelling   Peanut-containing Drug Products Swelling        Medication List     STOP taking these medications    aspirin 81 MG chewable tablet   ondansetron  4 MG tablet Commonly known as: Zofran        TAKE  these medications    ACCRUFeR  30 MG Caps Generic drug: Ferric Maltol  Take 1 capsule (30 mg total) by mouth daily.   ibuprofen  600 MG tablet Commonly known as: ADVIL  Take 1 tablet (600 mg total) by mouth every 6 (six) hours.   labetalol  300 MG tablet Commonly known as: NORMODYNE  Take 2 tablets (600 mg total) by mouth 2 (two) times daily. What changed:  medication strength how much to take Another medication with the same name was removed. Continue taking this medication, and follow the directions you see here.   prenatal multivitamin Tabs tablet Take 1 tablet by mouth daily at 12 noon.         Discharge home in stable condition Infant Feeding: Pumping breasts Infant Disposition:NICU Discharge instruction: per After Visit Summary and Postpartum booklet. Activity: Advance as tolerated. Pelvic rest for 6 weeks.  Diet: routine diet Anticipated Birth Control: Unsure Postpartum Appointment:6 weeks Additional Postpartum F/U: BP check 1 week Future Appointments:No future appointments. Follow up Visit:  Follow-up Information     Ob/Gyn, Tita Form Follow up in 6 week(s).   Why: for postpartum visit and within 1 week for blood pressure check Contact information: [address] [place] [address] [phone]                     06/28/2023 Leanne Pronto, DO

## 2023-06-28 NOTE — Lactation Note (Addendum)
 This note was copied from a baby's chart. Lactation Consultation Note  Patient Name: Diana Burke ZOXWR'[id number] Date: 06/28/2023 Age:27 hours Reason for consult: Follow-up assessment;NICU baby;Early term 37-38.6wks, Chronic HBP  P2, Baby 37 weeks and currently gavage feeding. Mother feels her breasts are full and is preparing to pump with DEBP using 18 mm flanges.   Mother pumped 45 ml.  Discussed hands free pumps at home and suggest sending a referral to [facility] for a [employer] pump.  Referral sent.  Unsure if mother will be discharged today due to blood pressure.  Trevor Fudge RN will contact LC if mother is going to be discharged today. Reviewed engorgement care.    Maternal Data Has patient been taught Hand Expression?: Yes  Feeding Mother's Current Feeding Choice: Breast Milk and Donor Milk  Lactation Tools Discussed/Used Tools: Pump;Flanges Flange Size: 18 Breast pump type: Double-Electric Breast Pump;Manual Reason for Pumping: stimulation Pumping frequency: q 3 hours for 15 min  Interventions Interventions: Education  Discharge [facility] Program: Yes  Consult Status Consult Status: NICU follow-up Date: 06/29/23 Follow-up type: In-patient   Luellen Sages  RN, IBCLC 06/28/2023, 8:14 AM

## 2023-06-29 ENCOUNTER — Ambulatory Visit (HOSPITAL_COMMUNITY): Payer: Self-pay

## 2023-06-29 NOTE — Lactation Note (Signed)
 This note was copied from a baby's chart.  NICU Lactation Consultation Note  Patient Name: Diana Burke [id number]'[id number] Date: 06/29/2023 Age:27 days  Reason for consult: Follow-up assessment; Breastfeeding assistance; NICU baby; Early term 27-38.6wks; RN request  SUBJECTIVE  LC in to assist with baby going to the breast for the first time.  Baby was decreased from 4L/min to 2L/min HFNC and is allowed to go to the breast with feeding cues.  Mom wanted to try.  LC asked Mom to pre-pump 10 mins until milk flow slowing down.  Mom has an abundant supply.  Mom with large, heavy breasts.  RN attempting to help with baby in football hold on left breast.  LC offered to assist.  Pillow support placed under baby.  Support placed under breast (folded towel).  Baby showing subtle cues and a little fussy and flailing his arms around.  LC swaddled "Diana Burke" and he settled down.  LC assisted baby to latch on deeper than just the nipple where he was latching onto.  Baby able to latch deeper and for 3 mins, he sucked with deep jaw extensions and swallows identified.  Baby came off on his own, unable to attain a latch after and placed STS on Mom's chest where he fell asleep.  Educated parents of consistent nutritive sucking and keeping track of how long he feeds for.  Reviewed NNS and NS patterns.  Talked about normal pauses during NS.  Briefly reviewed IDF and how he would need to suck with NS patterns consistently for > 5 mins for that to begin.  Plan- 1- STS during feeding times 2- Offer the half pumped breast with feeding cues, during scheduled feeding times. 3-Pump remainder of pumping after feeding to support a full milk supply 4- ask for LC prn  OBJECTIVE Infant data: Mother's Current Feeding Choice: Breast Milk and Donor Milk  O2 Device: HHFNC O2 Flow Rate (L/min): 3 L/min FiO2 (%): 21 %  Infant feeding assessment IDFTS - Readiness: 5 (HHFNC 4L, accepts paci, infant OOB with MOB) IDFTS - Quality: --  (HFNC 4L; infant with strong cues, paci OOB and skin to skin with MOB)   Maternal data: [id number] Vaginal, Spontaneous Has patient been taught Hand Expression?: Yes Pumping frequency: 7-8 times per 24 hrs Pumped volume: 240 mL Flange Size: 18 Hands-free pumping top sizes: Large [name])  WIC Program: Yes WIC Referral Sent?: Yes What county?: [place] Pump: Personal, Hands Free, DEBP  ASSESSMENT Infant: Latch: Repeated attempts needed to sustain latch, nipple held in mouth throughout feeding, stimulation needed to elicit sucking reflex. Audible Swallowing: A few with stimulation Type of Nipple: Everted at rest and after stimulation Comfort (Breast/Nipple): Soft / non-tender Hold (Positioning): Assistance needed to correctly position infant at breast and maintain latch. LATCH Score: 7  Feeding Status: Scheduled 8-11-2-5 Feeding method: Breast  Maternal: Milk volume: Abundant  INTERVENTIONS/PLAN Interventions: Interventions: Breast feeding basics reviewed; Assisted with latch; Skin to skin; Breast massage; Support pillows; Adjust position; Position options; DEBP; Education Discharge Education: Engorgement and breast care Tools: Pump; Flanges  Plan: Consult Status: NICU follow-up NICU Follow-up type: Weekly NICU follow up   Diana Burke 06/29/2023, 2:38 PM

## 2023-06-30 ENCOUNTER — Ambulatory Visit (HOSPITAL_COMMUNITY): Payer: Self-pay

## 2023-06-30 NOTE — Lactation Note (Signed)
 This note was copied from a baby's chart.  NICU Lactation Consultation Note  Patient Name: Diana Burke Date: 06/30/2023 Age:28 years  Reason for consult: Follow-up assessment; Breastfeeding assistance; Early term 37-38.6wks; NICU baby  SUBJECTIVE  LC in to assist/assess with 3 pm feeding at the breast.  Mom needing assistance with support and sandwiching breast while baby in cross cradle hold.  Baby latched after a few attempts.  Baby sucked with deep jaw extensions and swallows identified.  LC demonstrated to Mom and FOB how to gently tug on baby's chin to flange lower lip that was tucked.  Baby relaxed during feeding and showed no signs of stress throughout the feeding.    Baby came off the breast independently and nipple was not pinched.   Encouraged Mom to continue to pump after breastfeeding until baby is not being supplemented.  Recommendations- 1- STS with feedings 2- offer the breast with strong feeding cues at feeding times, timing the nutritive sucking time 3-Pump both breasts 20-30 mins.  OBJECTIVE Infant data: No data recorded O2 Device: HHFNC O2 Flow Rate (L/min): 1 L/min FiO2 (%): 21 %  Infant feeding assessment IDFTS - Readiness: 2   Maternal data: [id number] Vaginal, Spontaneous Pumping frequency: 6-8 times per 24 hrs Pumped volume: 120 mL Flange Size: 18 Hands-free pumping top sizes: Large Diana Burke)  WIC Program: Yes WIC Referral Sent?: Yes What county?: [place] Pump: Personal, Hands Free, DEBP  ASSESSMENT Infant: Latch: Grasps breast easily, tongue down, lips flanged, rhythmical sucking. Audible Swallowing: Spontaneous and intermittent Type of Nipple: Everted at rest and after stimulation Comfort (Breast/Nipple): Soft / non-tender Hold (Positioning): Assistance needed to correctly position infant at breast and maintain latch. LATCH Score: 9  Feeding Status: Scheduled 9-12-3-6 Feeding method: Breast; Tube/Gavage (Bolus)  Maternal: Milk  volume: Normal  INTERVENTIONS/PLAN Interventions: Interventions: Breast feeding basics reviewed; Assisted with latch; Skin to skin; Breast massage; Hand express; Breast compression; Adjust position; Support pillows; Position options; DEBP; Education Discharge Education: Engorgement and breast care; Outpatient recommendation Tools: Pump; Flanges; Hands-free pumping top Pump Education: Setup, frequency, and cleaning; Milk Storage  Plan: Consult Status: NICU follow-up NICU Follow-up type: Assist with IDF-2 (Mother does not need to pre-pump before breastfeeding)   [name] 06/30/2023, 3:28 PM

## 2023-07-05 ENCOUNTER — Ambulatory Visit (HOSPITAL_COMMUNITY): Payer: Self-pay

## 2023-07-05 ENCOUNTER — Telehealth (HOSPITAL_COMMUNITY): Payer: Self-pay | Admitting: *Deleted

## 2023-07-05 NOTE — Telephone Encounter (Signed)
 07/05/2023  Name: Diana Burke MRN: [id number] DOB: 10-19-1995  Reason for Call:  Transition of Care Hospital Discharge Call  Contact Status: Patient Contact Status: Complete  Language assistant needed: Interpreter Mode: Interpreter Not Needed        Follow-Up Questions: Do You Have Any Concerns About Your Health As You Heal From Delivery?: No Do You Have Any Concerns About Your Infants Health?: Infant in NICU  Edinburgh Postnatal Depression Scale:  In the Past 7 Days:    PHQ2-9 Depression Scale:     Discharge Follow-up: Edinburgh score requires follow up?:  (declines screening today, says her answers are the same as in the hospital when score was "0", endorses she is doing well emotionally) Patient was advised of the following resources:: Support Group, Breastfeeding Support Group  Post-discharge interventions: NA  Pearlie Bougie, RN 07/05/2023 11:36

## 2023-07-05 NOTE — Lactation Note (Signed)
 This note was copied from a baby's chart.  NICU Lactation Consultation Note  Patient Name: Diana Burke [id number] Date: 07/05/2023 Age:27 days  Reason for consult: Follow-up assessment; NICU baby; Early term 37-38.6wks  SUBJECTIVE  LC in to visit with P2 Mom of baby "[name]" on day of discharge from the NICU.  Baby has been primarily bottle feeding with some breastfeeds, yesterday every other feeding.  Mom had baby on the breast, without pillow support, baby resting on Mom's lap.  Mom's breasts are large and heavy, supported by a pumping bra.  Baby was not latched deeply when LC came in and slipped off the breast.  Mom states baby was latched for 5 mins.  Mom will supplement baby with EBM by bottle after breastfeeding. Mom has an abundant milk supply, but has dropped back her pumping from 8 times to 4 times per day.  Mom encouraged to resume pumping 8 times per day.  Mom desires OP lactation support, referral sent to clinic. Mom knows she can call for lactation support prn.  Mom has a Agricultural engineer at home.  She just received approval for a pump from [employer].  LC recommended the Spectra  S2.  Mom plans to purchase this today, flange inserts to be ordered on [employer].  OBJECTIVE Infant data: No data recorded O2 Device: Room Air  Infant feeding assessment IDFTS - Readiness: 1 IDFTS - Quality: 2   Maternal data: [id number] Vaginal, Spontaneous Pumping frequency: not consistent, dropped from 8 times to 4 times per 24 hrs Pumped volume: 180 mL (180-270 ml) Flange Size: 18  WIC Program: Yes WIC Referral Sent?: Yes What county?: [place] Pump: Personal, Hands Free, DEBP  Feeding Status: Ad lib Feeding method: Breast Nipple Type: Dr. Leticia Burke Preemie  Maternal: Milk volume: Abundant  INTERVENTIONS/PLAN Interventions: Interventions: Skin to skin; Breast feeding basics reviewed; DEBP; Education Discharge Education: Outpatient Epic message sent; Outpatient  recommendation Tools: Pump; Flanges; Hands-free pumping top; Bottle Pump Education: Setup, frequency, and cleaning; Milk Storage  Plan: Consult Status: Complete NICU Follow-up type: Baby's discharge   Diana Burke 07/05/2023, 1:10 PM
